# Patient Record
Sex: Female | Born: 1995 | Race: Black or African American | Hispanic: No | Marital: Single | Smoking: Never smoker
Health system: Southern US, Community
[De-identification: ages and names within clinical notes are randomized; demographics above are authoritative.]

## PROBLEM LIST (undated history)

## (undated) DIAGNOSIS — F419 Anxiety disorder, unspecified: Secondary | ICD-10-CM

---

## 2013-07-28 ENCOUNTER — Emergency Department (HOSPITAL_COMMUNITY)
Admission: EM | Admit: 2013-07-28 | Discharge: 2013-07-28 | Disposition: A | Discharge status: Home / Self Care | Payer: BC Managed Care – PPO | Attending: Emergency Medicine | Admitting: Emergency Medicine

## 2013-07-28 ENCOUNTER — Encounter (HOSPITAL_COMMUNITY): Payer: Self-pay | Admitting: Emergency Medicine

## 2013-07-28 DIAGNOSIS — R05 Cough: Secondary | ICD-10-CM | POA: Insufficient documentation

## 2013-07-28 DIAGNOSIS — R509 Fever, unspecified: Secondary | ICD-10-CM | POA: Insufficient documentation

## 2013-07-28 DIAGNOSIS — B9789 Other viral agents as the cause of diseases classified elsewhere: Secondary | ICD-10-CM | POA: Insufficient documentation

## 2013-07-28 DIAGNOSIS — R059 Cough, unspecified: Secondary | ICD-10-CM | POA: Insufficient documentation

## 2013-07-28 DIAGNOSIS — J029 Acute pharyngitis, unspecified: Secondary | ICD-10-CM

## 2013-07-28 LAB — RAPID STREP SCREEN (MED CTR MEBANE ONLY): Streptococcus, Group A Screen (Direct): NEGATIVE

## 2013-07-28 MED ORDER — HYDROCODONE-HOMATROPINE 5-1.5 MG/5ML PO SYRP: 5.0000 mL | ORAL_SOLUTION | Freq: Three times a day (TID) | ORAL | Status: DC | PRN

## 2013-07-28 NOTE — ED Provider Notes (Signed)
CSN: [id number]     Arrival date & time 07/28/13  1744 History  This chart was scribed for non-physician practitioner Antony Madura, PA-C working with Rolan Bucco, MD by Leone Payor, ED Scribe. This patient was seen in room WA02/WA02 and the patient's care was started at 1744.    Chief Complaint  Patient presents with  . Sore Throat    The history is provided by the patient. No language interpreter was used.    HPI Comments: Robin Ross is a 17 y.o. female who presents to the Emergency Department complaining of 5 days of gradual onset, gradually worsening, constant sore throat. Pt reports having subjective fever that has since resolved. She states nothing alleviates the pain. She has tried peppermint tea with no relief. She reports having a negative strep test 2 days ago. She reports having sick contacts. She denies cough, SOB, nasal congestion, rhinorrhea.   History reviewed. No pertinent past medical history. History reviewed. No pertinent past surgical history. History reviewed. No pertinent family history. History  Substance Use Topics  . Smoking status: Never Smoker   . Smokeless tobacco: Not on file  . Alcohol Use: No   OB History   Grav Para Term Preterm Abortions TAB SAB Ect Mult Living                 Review of Systems  Constitutional: Positive for fever (subjective).  HENT: Positive for sore throat. Negative for drooling and trouble swallowing.   Respiratory: Negative for cough and shortness of breath.   All other systems reviewed and are negative.    Allergies  Review of patient's allergies indicates no known allergies.  Home Medications   Current Outpatient Rx  Name  Route  Sig  Dispense  Refill  . ibuprofen (ADVIL,MOTRIN) 200 MG tablet   Oral   Take 400 mg by mouth every 6 (six) hours as needed for pain or fever.         . Pseudoeph-Doxylamine-DM-APAP (NYQUIL PO)   Oral   Take 2 tablets by mouth daily as needed (sleep).         Marland Kitchen  HYDROcodone-homatropine (HYCODAN) 5-1.5 MG/5ML syrup   Oral   Take 5 mLs by mouth every 8 (eight) hours as needed for cough.   60 mL   0    BP 118/55  Pulse 60  Temp(Src) 98.2 F (36.8 C) (Oral)  Resp 18  SpO2 100%  Physical Exam  Nursing note and vitals reviewed. Constitutional: She is oriented to person, place, and time. She appears well-developed and well-nourished. No distress.  HENT:  Head: Normocephalic and atraumatic.  Right Ear: Hearing, tympanic membrane, external ear and ear canal normal.  Left Ear: Hearing, tympanic membrane, external ear and ear canal normal.  Mouth/Throat: Uvula is midline and mucous membranes are normal. Posterior oropharyngeal erythema (mild ) present. No oropharyngeal exudate, posterior oropharyngeal edema or tonsillar abscesses.  Eyes: Conjunctivae and EOM are normal. No scleral icterus.  Neck: Normal range of motion. Neck supple.  Cardiovascular: Normal rate, regular rhythm and normal heart sounds.  Exam reveals no gallop and no friction rub.   No murmur heard. Pulmonary/Chest: Effort normal and breath sounds normal. No stridor. No respiratory distress. She has no wheezes. She has no rales. She exhibits no tenderness.  Musculoskeletal: Normal range of motion.  Lymphadenopathy:    She has no cervical adenopathy.  Neurological: She is alert and oriented to person, place, and time.  Skin: Skin is warm and dry. No rash  noted. She is not diaphoretic. No erythema. No pallor.  Psychiatric: She has a normal mood and affect. Her behavior is normal.    ED Course  Procedures   DIAGNOSTIC STUDIES: Oxygen Saturation is 100% on RA, normal by my interpretation.    COORDINATION OF CARE: 8:18 PM Will order rapid strep test. Discussed treatment plan with pt at bedside and pt agreed to plan.    Labs Review Labs Reviewed  RAPID STREP SCREEN  CULTURE, GROUP A STREP   Imaging Review No results found.  EKG Interpretation   None       MDM   1.  Viral pharyngitis    17 y/o otherwise healthy female presents for a cough and sore throat x5 days. She is well and nontoxic appearing, hemodynamically stable, and afebrile. Physical exam findings significant for mild posterior oropharyngeal erythema. Rapid strep screen negative today. Uvula midline without evidence of peritonsillar abscess and patient tolerating secretions well without difficulty. Airway patent. She is also without fever, tachycardia, tachypnea, dyspnea, or hypoxia; doubt pneumonia. Symptoms consistent with a viral illness. Patient appropriate for discharge with primary care followup as needed. Have prescribed Hycodan for cough and sore throat and recommended rest as well as adequate fluid hydration. Return precautions provided and patient agreeable to plan with no unaddressed concerns.   I personally performed the services described in this documentation, which was scribed in my presence. The recorded information has been reviewed and is accurate.    Antony Madura, PA-C 07/28/13 2124

## 2013-07-28 NOTE — ED Notes (Signed)
Pt reports had strep test done earlier today at school and it was negative.

## 2013-07-28 NOTE — ED Notes (Signed)
Pt states that she has been having a sore throat x 1 wk.  States that she had a headache and fever at the beginning of the week but that has since resolved.  Still c/o sore throat.

## 2013-07-28 NOTE — ED Provider Notes (Signed)
Medical screening examination/treatment/procedure(s) were performed by non-physician practitioner and as supervising physician I was immediately available for consultation/collaboration.   Strother Everitt, MD 07/28/13 2358 

## 2013-07-30 LAB — CULTURE, GROUP A STREP

## 2014-09-01 ENCOUNTER — Emergency Department (HOSPITAL_COMMUNITY)
Admission: EM | Admit: 2014-09-01 | Discharge: 2014-09-01 | Disposition: A | Discharge status: Home / Self Care | Payer: BC Managed Care – PPO | Source: Home / Self Care | Attending: Family Medicine | Admitting: Family Medicine

## 2014-09-01 ENCOUNTER — Encounter (HOSPITAL_COMMUNITY): Payer: Self-pay

## 2014-09-01 ENCOUNTER — Ambulatory Visit (HOSPITAL_COMMUNITY): Payer: BC Managed Care – PPO | Attending: Emergency Medicine

## 2014-09-01 DIAGNOSIS — R05 Cough: Secondary | ICD-10-CM | POA: Diagnosis not present

## 2014-09-01 DIAGNOSIS — J40 Bronchitis, not specified as acute or chronic: Secondary | ICD-10-CM

## 2014-09-01 DIAGNOSIS — R509 Fever, unspecified: Secondary | ICD-10-CM | POA: Diagnosis not present

## 2014-09-01 LAB — POCT PREGNANCY, URINE: PREG TEST UR: NEGATIVE

## 2014-09-01 MED ORDER — HYDROCOD POLST-CHLORPHEN POLST 10-8 MG/5ML PO LQCR: 5.0000 mL | Freq: Two times a day (BID) | ORAL | Status: DC | PRN

## 2014-09-01 MED ORDER — AZITHROMYCIN 250 MG PO TABS: ORAL_TABLET | ORAL | Status: DC

## 2014-09-01 NOTE — ED Notes (Signed)
Cough x 1 week, yellow and blood tinted

## 2014-09-01 NOTE — ED Provider Notes (Signed)
CSN: [id number]     Arrival date & time 09/01/14  0901 History   First MD Initiated Contact with Patient 09/01/14 [phone]     Chief Complaint  Patient presents with  . Cough   (Consider location/radiation/quality/duration/timing/severity/associated sxs/prior Treatment) HPI          18 year old female presents for evaluation of cough, fever, hemoptysis. Her cough initially began about 6 days ago. She had a high fever 103F for about 3 days. The cough has been productive of sputum and some blood as well. She feels like the fever has gotten slightly better but she still has a severe cough. The cough is constant but worse with laying flat. She denies nausea, vomiting, chest pain, shortness of breath. No pleuritic chest pain. No recent travel or sick contacts. No sinus pain or pressure  History reviewed. No pertinent past medical history. History reviewed. No pertinent past surgical history. History reviewed. No pertinent family history. History  Substance Use Topics  . Smoking status: Never Smoker   . Smokeless tobacco: Not on file  . Alcohol Use: No   OB History    No data available     Review of Systems  Constitutional: Positive for fever, chills and fatigue.  HENT: Positive for congestion. Negative for ear pain, rhinorrhea, sinus pressure and sore throat.   Respiratory: Positive for cough. Negative for chest tightness, shortness of breath and wheezing.   Cardiovascular: Negative for chest pain.  All other systems reviewed and are negative.   Allergies  Review of patient's allergies indicates no known allergies.  Home Medications   Prior to Admission medications   Medication Sig Start Date End Date Taking? Authorizing Provider  azithromycin (ZITHROMAX Z-PAK) 250 MG tablet Use as directed 09/01/14   Graylon GoodZachary H Ela Moffat, PA-C  chlorpheniramine-HYDROcodone (TUSSIONEX PENNKINETIC ER) 10-8 MG/5ML LQCR Take 5 mLs by mouth every 12 (twelve) hours as needed for cough. 09/01/14   Adrian BlackwaterZachary H  Yomar Mejorado, PA-C  HYDROcodone-homatropine (HYCODAN) 5-1.5 MG/5ML syrup Take 5 mLs by mouth every 8 (eight) hours as needed for cough. 07/28/13   Antony MaduraKelly Humes, PA-C  ibuprofen (ADVIL,MOTRIN) 200 MG tablet Take 400 mg by mouth every 6 (six) hours as needed for pain or fever.    Historical Provider, MD  Pseudoeph-Doxylamine-DM-APAP (NYQUIL PO) Take 2 tablets by mouth daily as needed (sleep).    Historical Provider, MD   BP 123/60 mmHg  Pulse 90  Temp(Src) 100.2 F (37.9 C) (Oral)  Resp 12  SpO2 99%  LMP 08/27/2014 Physical Exam  Constitutional: She is oriented to person, place, and time. Vital signs are normal. She appears well-developed and well-nourished. No distress.  HENT:  Head: Normocephalic and atraumatic.  Right Ear: External ear normal.  Left Ear: External ear normal.  Nose: Nose normal. Right sinus exhibits no maxillary sinus tenderness and no frontal sinus tenderness. Left sinus exhibits no maxillary sinus tenderness and no frontal sinus tenderness.  Mouth/Throat: Oropharynx is clear and moist. No oropharyngeal exudate.  Eyes: Conjunctivae are normal. Right eye exhibits no discharge. Left eye exhibits no discharge.  Neck: Normal range of motion. Neck supple.  Cardiovascular: Normal rate, regular rhythm and normal heart sounds.   Pulmonary/Chest: Effort normal and breath sounds normal. No respiratory distress. She has no wheezes. She has no rales.  Lymphadenopathy:    She has no cervical adenopathy.  Neurological: She is alert and oriented to person, place, and time. She has normal strength. Coordination normal.  Skin: Skin is warm and dry. No rash noted.  She is not diaphoretic.  Psychiatric: She has a normal mood and affect. Judgment normal.  Nursing note and vitals reviewed.   ED Course  Procedures (including critical care time) Labs Review Labs Reviewed  POCT PREGNANCY, URINE    Imaging Review Dg Chest 2 View  09/01/2014   CLINICAL DATA:  18 year old female with fever  and productive cough with yellow phlegm.  EXAM: CHEST  2 VIEW  COMPARISON:  None.  FINDINGS: The cardiomediastinal silhouette is unremarkable.  There is no evidence of focal airspace disease, pulmonary edema, suspicious pulmonary nodule/mass, pleural effusion, or pneumothorax. No acute bony abnormalities are identified.  IMPRESSION: No active cardiopulmonary disease.   Electronically Signed   By: Laveda AbbeJeff  Hu M.D.   On: 09/01/2014 11:10     MDM   1. Bronchitis    Chest x-ray is normal. Will provide azithromycin given the duration of illness and still having a fever. Suspicion for bacterial etiology. Also cough suppressant. Follow-up when necessary   Meds ordered this encounter  Medications  . azithromycin (ZITHROMAX Z-PAK) 250 MG tablet    Sig: Use as directed    Dispense:  6 each    Refill:  0    Order Specific Question:  Supervising Provider    Answer:  Clementeen GrahamOREY, EVAN, S [[id number]]  . chlorpheniramine-HYDROcodone (TUSSIONEX PENNKINETIC ER) 10-8 MG/5ML LQCR    Sig: Take 5 mLs by mouth every 12 (twelve) hours as needed for cough.    Dispense:  115 mL    Refill:  0    Order Specific Question:  Supervising Provider    Answer:  Clementeen GrahamOREY, EVAN, Kathie RhodesS [[id number]]      Graylon GoodZachary H Dawana Asper, PA-C 09/01/14 1121

## 2014-09-01 NOTE — Discharge Instructions (Signed)
Upper Respiratory Infection, Adult An upper respiratory infection (URI) is also sometimes known as the common cold. The upper respiratory tract includes the nose, sinuses, throat, trachea, and bronchi. Bronchi are the airways leading to the lungs. Most people improve within 1 week, but symptoms can last up to 2 weeks. A residual cough may last even longer.  CAUSES Many different viruses can infect the tissues lining the upper respiratory tract. The tissues become irritated and inflamed and often become very moist. Mucus production is also common. A cold is contagious. You can easily spread the virus to others by oral contact. This includes kissing, sharing a glass, coughing, or sneezing. Touching your mouth or nose and then touching a surface, which is then touched by another person, can also spread the virus. SYMPTOMS  Symptoms typically develop 1 to 3 days after you come in contact with a cold virus. Symptoms vary from person to person. They may include:  Runny nose.  Sneezing.  Nasal congestion.  Sinus irritation.  Sore throat.  Loss of voice (laryngitis).  Cough.  Fatigue.  Muscle aches.  Loss of appetite.  Headache.  Low-grade fever. DIAGNOSIS  You might diagnose your own cold based on familiar symptoms, since most people get a cold 2 to 3 times a year. Your caregiver can confirm this based on your exam. Most importantly, your caregiver can check that your symptoms are not due to another disease such as strep throat, sinusitis, pneumonia, asthma, or epiglottitis. Blood tests, throat tests, and X-rays are not necessary to diagnose a common cold, but they may sometimes be helpful in excluding other more serious diseases. Your caregiver will decide if any further tests are required. RISKS AND COMPLICATIONS  You may be at risk for a more severe case of the common cold if you smoke cigarettes, have chronic heart disease (such as heart failure) or lung disease (such as asthma), or if  you have a weakened immune system. The very young and very old are also at risk for more serious infections. Bacterial sinusitis, middle ear infections, and bacterial pneumonia can complicate the common cold. The common cold can worsen asthma and chronic obstructive pulmonary disease (COPD). Sometimes, these complications can require emergency medical care and may be life-threatening. PREVENTION  The best way to protect against getting a cold is to practice good hygiene. Avoid oral or hand contact with people with cold symptoms. Wash your hands often if contact occurs. There is no clear evidence that vitamin C, vitamin E, echinacea, or exercise reduces the chance of developing a cold. However, it is always recommended to get plenty of rest and practice good nutrition. TREATMENT  Treatment is directed at relieving symptoms. There is no cure. Antibiotics are not effective, because the infection is caused by a virus, not by bacteria. Treatment may include:  Increased fluid intake. Sports drinks offer valuable electrolytes, sugars, and fluids.  Breathing heated mist or steam (vaporizer or shower).  Eating chicken soup or other clear broths, and maintaining good nutrition.  Getting plenty of rest.  Using gargles or lozenges for comfort.  Controlling fevers with ibuprofen or acetaminophen as directed by your caregiver.  Increasing usage of your inhaler if you have asthma. Zinc gel and zinc lozenges, taken in the first 24 hours of the common cold, can shorten the duration and lessen the severity of symptoms. Pain medicines may help with fever, muscle aches, and throat pain. A variety of non-prescription medicines are available to treat congestion and runny nose. Your caregiver   can make recommendations and may suggest nasal or lung inhalers for other symptoms.  HOME CARE INSTRUCTIONS   Only take over-the-counter or prescription medicines for pain, discomfort, or fever as directed by your  caregiver.  Use a warm mist humidifier or inhale steam from a shower to increase air moisture. This may keep secretions moist and make it easier to breathe.  Drink enough water and fluids to keep your urine clear or pale yellow.  Rest as needed.  Return to work when your temperature has returned to normal or as your caregiver advises. You may need to stay home longer to avoid infecting others. You can also use a face mask and careful hand washing to prevent spread of the virus. SEEK MEDICAL CARE IF:   After the first few days, you feel you are getting worse rather than better.  You need your caregiver's advice about medicines to control symptoms.  You develop chills, worsening shortness of breath, or brown or red sputum. These may be signs of pneumonia.  You develop yellow or brown nasal discharge or pain in the face, especially when you bend forward. These may be signs of sinusitis.  You develop a fever, swollen neck glands, pain with swallowing, or white areas in the back of your throat. These may be signs of strep throat. SEEK IMMEDIATE MEDICAL CARE IF:   You have a fever.  You develop severe or persistent headache, ear pain, sinus pain, or chest pain.  You develop wheezing, a prolonged cough, cough up blood, or have a change in your usual mucus (if you have chronic lung disease).  You develop sore muscles or a stiff neck. Document Released: 03/23/2001 Document Revised: 12/20/2011 Document Reviewed: 01/02/2014 [employer] Patient Information 2015 [employer]. This information is not intended to replace advice given to you by your health care provider. Make sure you discuss any questions you have with your health care provider.  

## 2015-01-07 ENCOUNTER — Encounter (HOSPITAL_COMMUNITY): Payer: Self-pay | Admitting: Emergency Medicine

## 2015-01-07 ENCOUNTER — Emergency Department (HOSPITAL_COMMUNITY)
Admission: EM | Admit: 2015-01-07 | Discharge: 2015-01-07 | Disposition: A | Discharge status: Home / Self Care | Payer: BLUE CROSS/BLUE SHIELD | Source: Home / Self Care | Attending: Emergency Medicine | Admitting: Emergency Medicine

## 2015-01-07 DIAGNOSIS — J069 Acute upper respiratory infection, unspecified: Secondary | ICD-10-CM

## 2015-01-07 DIAGNOSIS — B9789 Other viral agents as the cause of diseases classified elsewhere: Principal | ICD-10-CM

## 2015-01-07 MED ORDER — PREDNISONE 50 MG PO TABS: ORAL_TABLET | ORAL | Status: DC

## 2015-01-07 MED ORDER — HYDROCODONE-HOMATROPINE 5-1.5 MG/5ML PO SYRP: 5.0000 mL | ORAL_SOLUTION | Freq: Three times a day (TID) | ORAL | Status: DC | PRN

## 2015-01-07 MED ORDER — CETIRIZINE HCL 10 MG PO TABS: 10.0000 mg | ORAL_TABLET | Freq: Every day | ORAL | Status: DC

## 2015-01-07 NOTE — ED Notes (Signed)
Pt states that she has had a cough with congestion for a week

## 2015-01-07 NOTE — ED Provider Notes (Signed)
CSN: [id number]     Arrival date & time 01/07/15  1747 History   First MD Initiated Contact with Patient 01/07/15 1912     Chief Complaint  Patient presents with  . URI   (Consider location/radiation/quality/duration/timing/severity/associated sxs/prior Treatment) HPI  She is a 19 year old woman here for evaluation of cough. She states her symptoms started about 5 days ago with nasal congestion and cough. She reports a subjective fever early in the course, this has resolved. She denies any sore throat, nausea, vomiting, ear pain. She has tried over-the-counter Mucinex and DayQuil with minimal improvement. She is worried because she had bronchitis last fall and does not want to get that sick again.  History reviewed. No pertinent past medical history. History reviewed. No pertinent past surgical history. History reviewed. No pertinent family history. History  Substance Use Topics  . Smoking status: Never Smoker   . Smokeless tobacco: Not on file  . Alcohol Use: No   OB History    No data available     Review of Systems  Constitutional: Positive for fever. Negative for chills and appetite change.  HENT: Positive for congestion and rhinorrhea. Negative for ear pain and sore throat.   Respiratory: Positive for cough. Negative for shortness of breath.   Gastrointestinal: Negative for nausea and vomiting.    Allergies  Review of patient's allergies indicates no known allergies.  Home Medications   Prior to Admission medications   Medication Sig Start Date End Date Taking? Authorizing Provider  cetirizine (ZYRTEC) 10 MG tablet Take 1 tablet (10 mg total) by mouth daily. 01/07/15   Charm RingsErin J Honig, MD  HYDROcodone-homatropine (HYCODAN) 5-1.5 MG/5ML syrup Take 5 mLs by mouth every 8 (eight) hours as needed for cough. 01/07/15   Charm RingsErin J Honig, MD  ibuprofen (ADVIL,MOTRIN) 200 MG tablet Take 400 mg by mouth every 6 (six) hours as needed for pain or fever.    Historical Provider, MD   predniSONE (DELTASONE) 50 MG tablet Take 1 pill daily for 5 days. 01/07/15   Charm RingsErin J Honig, MD   BP 105/48 mmHg  Pulse 67  Temp(Src) 98.1 F (36.7 C) (Oral)  Resp 14  SpO2 97%  LMP  Physical Exam  Constitutional: She is oriented to person, place, and time. She appears well-developed and well-nourished. No distress.  HENT:  Nose: Mucosal edema and rhinorrhea present.  Mouth/Throat: Oropharynx is clear and moist. No oropharyngeal exudate.  Neck: Neck supple.  Cardiovascular: Normal rate, regular rhythm and normal heart sounds.   No murmur heard. Pulmonary/Chest: Effort normal and breath sounds normal. No respiratory distress. She has no wheezes. She has no rales.  Lymphadenopathy:    She has no cervical adenopathy.  Neurological: She is alert and oriented to person, place, and time.    ED Course  Procedures (including critical care time) Labs Review Labs Reviewed - No data to display  Imaging Review No results found.   MDM   1. Viral URI with cough    Symptomatic treatment with Zyrtec and Hycodan cough syrup. If she is not improving in 2 days, she filled the prescription for prednisone. Follow-up as needed.    Charm RingsErin J Honig, MD 01/07/15 2001

## 2015-01-07 NOTE — Discharge Instructions (Signed)
You have a cold virus. Take Zyrtec daily to help with the mucus. Use the cough syrup at night as needed. Do not drive while taking this medicine. If you are not starting to improve in 2 days, take the prednisone. Follow-up as needed.

## 2015-12-06 ENCOUNTER — Emergency Department (HOSPITAL_COMMUNITY)
Admission: EM | Admit: 2015-12-06 | Discharge: 2015-12-06 | Disposition: A | Discharge status: Home / Self Care | Payer: BLUE CROSS/BLUE SHIELD | Source: Home / Self Care | Attending: Emergency Medicine | Admitting: Emergency Medicine

## 2015-12-06 ENCOUNTER — Encounter (HOSPITAL_COMMUNITY): Payer: Self-pay | Admitting: *Deleted

## 2015-12-06 DIAGNOSIS — J40 Bronchitis, not specified as acute or chronic: Secondary | ICD-10-CM | POA: Diagnosis not present

## 2015-12-06 MED ORDER — ALBUTEROL SULFATE HFA 108 (90 BASE) MCG/ACT IN AERS: 2.0000 | INHALATION_SPRAY | RESPIRATORY_TRACT | Status: DC | PRN

## 2015-12-06 MED ORDER — AMOXICILLIN 250 MG PO CAPS: 250.0000 mg | ORAL_CAPSULE | Freq: Two times a day (BID) | ORAL | Status: AC

## 2015-12-06 NOTE — Discharge Instructions (Signed)
Upper Respiratory Infection, Adult Most upper respiratory infections (URIs) are caused by a virus. A URI affects the nose, throat, and upper air passages. The most common type of URI is often called "the common cold." HOME CARE   Take medicines only as told by your doctor.  Gargle warm saltwater or take cough drops to comfort your throat as told by your doctor.  Use a warm mist humidifier or inhale steam from a shower to increase air moisture. This may make it easier to breathe.  Drink enough fluid to keep your pee (urine) clear or pale yellow.  Eat soups and other clear broths.  Have a healthy diet.  Rest as needed.  Go back to work when your fever is gone or your doctor says it is okay.  You may need to stay home longer to avoid giving your URI to others.  You can also wear a face mask and wash your hands often to prevent spread of the virus.  Use your inhaler more if you have asthma.  Do not use any tobacco products, including cigarettes, chewing tobacco, or electronic cigarettes. If you need help quitting, ask your doctor. GET HELP IF:  You are getting worse, not better.  Your symptoms are not helped by medicine.  You have chills.  You are getting more short of breath.  You have brown or red mucus.  You have yellow or brown discharge from your nose.  You have pain in your face, especially when you bend forward.  You have a fever.  You have puffy (swollen) neck glands.  You have pain while swallowing.  You have white areas in the back of your throat. GET HELP RIGHT AWAY IF:   You have very bad or constant:  Headache.  Ear pain.  Pain in your forehead, behind your eyes, and over your cheekbones (sinus pain).  Chest pain.  You have long-lasting (chronic) lung disease and any of the following:  Wheezing.  Long-lasting cough.  Coughing up blood.  A change in your usual mucus.  You have a stiff neck.  You have changes in  your:  Vision.  Hearing.  Thinking.  Mood. MAKE SURE YOU:   Understand these instructions.  Will watch your condition.  Will get help right away if you are not doing well or get worse.   This information is not intended to replace advice given to you by your health care provider. Make sure you discuss any questions you have with your health care provider.   Document Released: 03/15/2008 Document Revised: 02/11/2015 Document Reviewed: 01/02/2014 Elsevier Interactive Patient Education 2016 [employer]. Smoking Cessation, Tips for Success If you are ready to quit smoking, congratulations! You have chosen to help yourself be healthier. Cigarettes bring nicotine, tar, carbon monoxide, and other irritants into your body. Your lungs, heart, and blood vessels will be able to work better without these poisons. There are many different ways to quit smoking. Nicotine gum, nicotine patches, a nicotine inhaler, or nicotine nasal spray can help with physical craving. Hypnosis, support groups, and medicines help break the habit of smoking. WHAT THINGS CAN I DO TO MAKE QUITTING EASIER?  Here are some tips to help you quit for good:  Pick a date when you will quit smoking completely. Tell all of your friends and family about your plan to quit on that date.  Do not try to slowly cut down on the number of cigarettes you are smoking. Pick a quit date and quit smoking completely  starting on that day.  Throw away all cigarettes.   Clean and remove all ashtrays from your home, work, and car.  On a card, write down your reasons for quitting. Carry the card with you and read it when you get the urge to smoke.  Cleanse your body of nicotine. Drink enough water and fluids to keep your urine clear or pale yellow. Do this after quitting to flush the nicotine from your body.  Learn to predict your moods. Do not let a bad situation be your excuse to have a cigarette. Some situations in your life might  tempt you into wanting a cigarette.  Never have "just one" cigarette. It leads to wanting another and another. Remind yourself of your decision to quit.  Change habits associated with smoking. If you smoked while driving or when feeling stressed, try other activities to replace smoking. Stand up when drinking your coffee. Brush your teeth after eating. Sit in a different chair when you read the paper. Avoid alcohol while trying to quit, and try to drink fewer caffeinated beverages. Alcohol and caffeine may urge you to smoke.  Avoid foods and drinks that can trigger a desire to smoke, such as sugary or spicy foods and alcohol.  Ask people who smoke not to smoke around you.  Have something planned to do right after eating or having a cup of coffee. For example, plan to take a walk or exercise.  Try a relaxation exercise to calm you down and decrease your stress. Remember, you may be tense and nervous for the first 2 weeks after you quit, but this will pass.  Find new activities to keep your hands busy. Play with a pen, coin, or rubber band. Doodle or draw things on paper.  Brush your teeth right after eating. This will help cut down on the craving for the taste of tobacco after meals. You can also try mouthwash.   Use oral substitutes in place of cigarettes. Try using lemon drops, carrots, cinnamon sticks, or chewing gum. Keep them handy so they are available when you have the urge to smoke.  When you have the urge to smoke, try deep breathing.  Designate your home as a nonsmoking area.  If you are a heavy smoker, ask your health care provider about a prescription for nicotine chewing gum. It can ease your withdrawal from nicotine.  Reward yourself. Set aside the cigarette money you save and buy yourself something nice.  Look for support from others. Join a support group or smoking cessation program. Ask someone at home or at work to help you with your plan to quit smoking.  Always ask  yourself, "Do I need this cigarette or is this just a reflex?" Tell yourself, "Today, I choose not to smoke," or "I do not want to smoke." You are reminding yourself of your decision to quit.  Do not replace cigarette smoking with electronic cigarettes (commonly called e-cigarettes). The safety of e-cigarettes is unknown, and some may contain harmful chemicals.  If you relapse, do not give up! Plan ahead and think about what you will do the next time you get the urge to smoke. HOW WILL I FEEL WHEN I QUIT SMOKING? You may have symptoms of withdrawal because your body is used to nicotine (the addictive substance in cigarettes). You may crave cigarettes, be irritable, feel very hungry, cough often, get headaches, or have difficulty concentrating. The withdrawal symptoms are only temporary. They are strongest when you first quit but will go  away within 10-14 days. When withdrawal symptoms occur, stay in control. Think about your reasons for quitting. Remind yourself that these are signs that your body is healing and getting used to being without cigarettes. Remember that withdrawal symptoms are easier to treat than the major diseases that smoking can cause.  Even after the withdrawal is over, expect periodic urges to smoke. However, these cravings are generally short lived and will go away whether you smoke or not. Do not smoke! WHAT RESOURCES ARE AVAILABLE TO HELP ME QUIT SMOKING? Your health care provider can direct you to community resources or hospitals for support, which may include:  Group support.  Education.  Hypnosis.  Therapy.   This information is not intended to replace advice given to you by your health care provider. Make sure you discuss any questions you have with your health care provider.   Document Released: 06/25/2004 Document Revised: 10/18/2014 Document Reviewed: 03/15/2013 Elsevier Interactive Patient Education 2016 Elsevier Inc. Acute Bronchitis Bronchitis is when the  airways that extend from the windpipe into the lungs get red, puffy, and painful (inflamed). Bronchitis often causes thick spit (mucus) to develop. This leads to a cough. A cough is the most common symptom of bronchitis. In acute bronchitis, the condition usually begins suddenly and goes away over time (usually in 2 weeks). Smoking, allergies, and asthma can make bronchitis worse. Repeated episodes of bronchitis may cause more lung problems. HOME CARE  Rest.  Drink enough fluids to keep your pee (urine) clear or pale yellow (unless you need to limit fluids as told by your doctor).  Only take over-the-counter or prescription medicines as told by your doctor.  Avoid smoking and secondhand smoke. These can make bronchitis worse. If you are a smoker, think about using nicotine gum or skin patches. Quitting smoking will help your lungs heal faster.  Reduce the chance of getting bronchitis again by:  Washing your hands often.  Avoiding people with cold symptoms.  Trying not to touch your hands to your mouth, nose, or eyes.  Follow up with your doctor as told. GET HELP IF: Your symptoms do not improve after 1 week of treatment. Symptoms include:  Cough.  Fever.  Coughing up thick spit.  Body aches.  Chest congestion.  Chills.  Shortness of breath.  Sore throat. GET HELP RIGHT AWAY IF:   You have an increased fever.  You have chills.  You have severe shortness of breath.  You have bloody thick spit (sputum).  You throw up (vomit) often.  You lose too much body fluid (dehydration).  You have a severe headache.  You faint. MAKE SURE YOU:   Understand these instructions.  Will watch your condition.  Will get help right away if you are not doing well or get worse.   This information is not intended to replace advice given to you by your health care provider. Make sure you discuss any questions you have with your health care provider.   Document Released:  03/15/2008 Document Revised: 05/30/2013 Document Reviewed: 03/20/2013 Elsevier Interactive Patient Education [employer]

## 2015-12-06 NOTE — ED Notes (Signed)
C/O productive cough x 4 days without fever.  Has not been taking any meds for sxs.

## 2015-12-06 NOTE — ED Provider Notes (Signed)
CSN: [id number]     Arrival date & time 12/06/15  1354 History   First MD Initiated Contact with Patient 12/06/15 1513     Chief Complaint  Patient presents with  . Cough   (Consider location/radiation/quality/duration/timing/severity/associated sxs/prior Treatment) HPI Pt presents with the cc of cough Symptoms started almost 2 weeks ago Symptoms get worse with cold night air Symptoms get better with use of cough medicine for short periods of time No previous symptoms of this nature Pain score 3 Other symptoms include blood tinged sputum on occasion, no fever.   History reviewed. No pertinent past medical history. History reviewed. No pertinent past surgical history. No family history on file. Social History  Substance Use Topics  . Smoking status: Never Smoker   . Smokeless tobacco: None  . Alcohol Use: Yes     Comment: occasionally   OB History    No data available     Review of Systems Cough, sputum, no fever Allergies  Review of patient's allergies indicates no known allergies.  Home Medications   Prior to Admission medications   Medication Sig Start Date End Date Taking? Authorizing Provider  albuterol (PROVENTIL HFA;VENTOLIN HFA) 108 (90 Base) MCG/ACT inhaler Inhale 2 puffs into the lungs every 4 (four) hours as needed for wheezing or shortness of breath. 12/06/15   Tharon Aquas, PA  amoxicillin (AMOXIL) 250 MG capsule Take 1 capsule (250 mg total) by mouth 2 (two) times daily. 12/06/15 12/13/15  Tharon Aquas, PA   Meds Ordered and Administered this Visit  Medications - No data to display  BP 107/69 mmHg  Pulse 72  Temp(Src) 98.2 F (36.8 C) (Oral)  Resp 18  SpO2 100%  LMP 11/22/2015 (Exact Date) No data found.   Physical Exam NURSES NOTES AND VITAL SIGNS REVIEWED. CONSTITUTIONAL: Well developed, well nourished, no acute distress HEENT: normocephalic, atraumatic, right and left TM's are normal EYES: Conjunctiva normal NECK:normal ROM, supple, no  adenopathy PULMONARY:No respiratory distress, normal effort, Lungs: CTAb/l, no wheezes, or increased work of breathing CARDIOVASCULAR: RRR, no murmur ABDOMEN: soft, ND, NT, +'ve BS MUSCULOSKELETAL: Normal ROM of all extremities,  SKIN: warm and dry without rash PSYCHIATRIC: Mood and affect, behavior are normal  ED Course  Procedures (including critical care time)  Labs Review Labs Reviewed - No data to display  Imaging Review No results found.   Visual Acuity Review  Right Eye Distance:   Left Eye Distance:   Bilateral Distance:    Right Eye Near:   Left Eye Near:    Bilateral Near:         MDM   1. Bronchitis    Patient is reassured that there is no indication for more advance testing at this time.  Patient is advised to continue home symptomatic treatment.  Patient is advised that if there are new or worsening symptoms or attend the emergency department, or contact primary care provider. Instructions of care provided discharged home in stable condition. Return to work/school note provided.  THIS NOTE WAS GENERATED USING A VOICE RECOGNITION SOFTWARE PROGRAM. ALL REASONABLE EFFORTS  WERE MADE TO PROOFREAD THIS DOCUMENT FOR ACCURACY.     Tharon Aquas, PA 12/06/15 Paulo Fruit

## 2016-02-24 ENCOUNTER — Ambulatory Visit (INDEPENDENT_AMBULATORY_CARE_PROVIDER_SITE_OTHER): Payer: BLUE CROSS/BLUE SHIELD | Admitting: Physician Assistant

## 2016-02-24 VITALS — BP 118/70 | HR 66 | Temp 98.7°F | Resp 17 | Ht 65.5 in | Wt 128.0 lb

## 2016-02-24 DIAGNOSIS — B373 Candidiasis of vulva and vagina: Secondary | ICD-10-CM | POA: Diagnosis not present

## 2016-02-24 DIAGNOSIS — Z3041 Encounter for surveillance of contraceptive pills: Secondary | ICD-10-CM

## 2016-02-24 DIAGNOSIS — B3731 Acute candidiasis of vulva and vagina: Secondary | ICD-10-CM

## 2016-02-24 DIAGNOSIS — N898 Other specified noninflammatory disorders of vagina: Secondary | ICD-10-CM | POA: Diagnosis not present

## 2016-02-24 LAB — POCT WET + KOH PREP: Trich by wet prep: ABSENT

## 2016-02-24 LAB — GLUCOSE, POCT (MANUAL RESULT ENTRY): POC Glucose: 110 mg/dl — AB (ref 70–99)

## 2016-02-24 LAB — POCT URINE PREGNANCY: Preg Test, Ur: NEGATIVE

## 2016-02-24 MED ORDER — LEVONORGEST-ETH ESTRAD 91-DAY 0.15-0.03 &0.01 MG PO TABS: 1.0000 | ORAL_TABLET | Freq: Every day | ORAL | Status: DC

## 2016-02-24 MED ORDER — FLUCONAZOLE 150 MG PO TABS: 150.0000 mg | ORAL_TABLET | Freq: Once | ORAL | Status: DC

## 2016-02-24 NOTE — Progress Notes (Signed)
[facility] [address], [place] [address] [phone]  Date:  02/24/2016   Name:  Tymeshia Awan   DOB:  16-Oct-1995   MRN:  [id number]  PCP:  No primary care provider on file.    History of Present Illness:  Iva Hillman is a 20 y.o. female patient who presents to [facility] for cc of 2 days of vaginal pruritus and abnormal discharge. This has been for the last 2 days.  She has no odor.  No vision changes, nausea, vomiting, diarrhea, abdominal pain.  No hematuria, frequency, or dysuria.   She is sexually active unprotected.  She stopped her birth control.  She had felt nausea.  She does not eat with the medication.    She eats infrequently.  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  She does not want to get pregnant at this time.  She aspires to join the [employer] once school is over in 7 months.     There are no active problems to display for this patient.   No past medical history on file.  No past surgical history on file.  Social History  Substance Use Topics  . Smoking status: Never Smoker   . Smokeless tobacco: None  . Alcohol Use: Yes     Comment: occasionally    No family history on file.  No Known Allergies  Medication list has been reviewed and updated.  Current Outpatient Prescriptions on File Prior to Visit  Medication Sig Dispense Refill  . albuterol (PROVENTIL HFA;VENTOLIN HFA) 108 (90 Base) MCG/ACT inhaler Inhale 2 puffs into the lungs every 4 (four) hours as needed for wheezing or shortness of breath. (Patient not taking: Reported on 02/24/2016) 1 Inhaler 0  . [DISCONTINUED] cetirizine (ZYRTEC) 10 MG tablet Take 1 tablet (10 mg total) by mouth daily. 30 tablet 0   No current facility-administered medications on file prior to visit.    ROS ROS otherwise unremarkable unless listed above.   Physical Examination: BP 118/70 mmHg  Pulse 66  Temp(Src) 98.7 F (37.1 C) (Oral)  Resp 17  Ht 5' 5.5"  (1.664 m)  Wt 128 lb (58.06 kg)  BMI 20.97 kg/m2  SpO2 99%  LMP 02/12/2016 Ideal Body Weight: Weight in (lb) to have BMI = 25: 152.2  Physical Exam  Constitutional: She is oriented to person, place, and time. She appears well-developed and well-nourished. No distress.  HENT:  Head: Normocephalic and atraumatic.  Right Ear: External ear normal.  Left Ear: External ear normal.  Eyes: Conjunctivae and EOM are normal. Pupils are equal, round, and reactive to light.  Cardiovascular: Normal rate.   Pulmonary/Chest: Effort normal. No respiratory distress.  Genitourinary: Pelvic exam was performed with patient supine. There is no rash on the left labia. Cervix exhibits discharge. Cervix exhibits no friability. No tenderness or bleeding in the vagina. Vaginal discharge (white) found.  Neurological: She is alert and oriented to person, place, and time.  Skin: She is not diaphoretic.  Psychiatric: She has a normal mood and affect. Her behavior is normal.    Results for orders placed or performed in visit on 02/24/16  POCT urine pregnancy  Result Value Ref Range   Preg Test, Ur Negative Negative  POCT glucose (manual entry)  Result Value Ref Range   POC Glucose 110 (A) 70 - 99 mg/dl  POCT Wet + KOH Prep  Result Value Ref Range   Yeast by KOH Present Present, Absent  Yeast by wet prep Present Present, Absent   WBC by wet prep Few None, Few, Too numerous to count   Clue Cells Wet Prep HPF POC Few (A) None, Too numerous to count   Trich by wet prep Absent Present, Absent   Bacteria Wet Prep HPF POC Few None, Few, Too numerous to count   Epithelial Cells By Newell RubbermaidWet Pref ([facility]) Few None, Few, Too numerous to count   RBC,UR,HPF,POC None None RBC/hpf     Assessment and Plan: Cloria SpringCarissa Jefcoat is a 20 y.o. female who is here today for abnormal vaginal discharge. We will treat with diflucan at this time.   I have advised and encouraged that we start her on a birth control to avoid pregnancy and  continuity of her life goals.   I have given her information of the nexplanon, if she would like to start this medication.  She will contact us if she would like this prior authorized from her insurance. We will start her on a birth control continuance--she wishes to avoid her menses.  I have advised that this will more readily work if we start during her cycle.  But she appears reluctant to use condoms with no true definitive reason why.  It is best that she start bc asap.  Yeast infection of the vagina - Plan: fluconazole (DIFLUCAN) 150 MG tablet  Vaginal discharge - Plan: POCT urine pregnancy, POCT glucose (manual entry), POCT Wet + KOH Prep  Uses oral contraception - Plan: Levonorgestrel-Ethinyl Estradiol (AMETHIA,CAMRESE) 0.15-0.03 &0.01 MG tablet  Trena PlattStephanie English, PA-C [facility] 02/24/2016 9:24 AM

## 2016-02-24 NOTE — Patient Instructions (Addendum)
IF you received an x-ray today, you will receive an invoice from [facility]. Please contact [facility] at [phone] with questions or concerns regarding your invoice.   IF you received labwork today, you will receive an invoice from [employer]. Please contact [facility] at [phone] with questions or concerns regarding your invoice.   Our billing staff will not be able to assist you with questions regarding bills from these companies.  You will be contacted with the lab results as soon as they are available. The fastest way to get your results is to activate your My Chart account. Instructions are located on the last page of this paperwork. If you have not heard from [place] regarding the results in 2 weeks, please contact this office.    I am sending you away with information regarding the seasonique and the nexplanon.  Please use a condom if you are engaging in any sexual activity.  This will keep you to goal.  I would like you to contact me if you would like to get the nexplanon.  Monilial Vaginitis Vaginitis in a soreness, swelling and redness (inflammation) of the vagina and vulva. Monilial vaginitis is not a sexually transmitted infection. CAUSES  Yeast vaginitis is caused by yeast (candida) that is normally found in your vagina. With a yeast infection, the candida has overgrown in number to a point that upsets the chemical balance. SYMPTOMS   White, thick vaginal discharge.  Swelling, itching, redness and irritation of the vagina and possibly the lips of the vagina (vulva).  Burning or painful urination.  Painful intercourse. DIAGNOSIS  Things that may contribute to monilial vaginitis are:  Postmenopausal and virginal states.  Pregnancy.  Infections.  Being tired, sick or stressed, especially if you had monilial vaginitis in the past.  Diabetes. Good control will help lower the chance.  Birth control pills.  Tight  fitting garments.  Using bubble bath, feminine sprays, douches or deodorant tampons.  Taking certain medications that kill germs (antibiotics).  Sporadic recurrence can occur if you become ill. TREATMENT  Your caregiver will give you medication.  There are several kinds of anti monilial vaginal creams and suppositories specific for monilial vaginitis. For recurrent yeast infections, use a suppository or cream in the vagina 2 times a week, or as directed.  Anti-monilial or steroid cream for the itching or irritation of the vulva may also be used. Get your caregiver's permission.  Painting the vagina with methylene blue solution may help if the monilial cream does not work.  Eating yogurt may help prevent monilial vaginitis. HOME CARE INSTRUCTIONS   Finish all medication as prescribed.  Do not have sex until treatment is completed or after your caregiver tells you it is okay.  Take warm sitz baths.  Do not douche.  Do not use tampons, especially scented ones.  Wear cotton underwear.  Avoid tight pants and panty hose.  Tell your sexual partner that you have a yeast infection. They should go to their caregiver if they have symptoms such as mild rash or itching.  Your sexual partner should be treated as well if your infection is difficult to eliminate.  Practice safer sex. Use condoms.  Some vaginal medications cause latex condoms to fail. Vaginal medications that harm condoms are:  Cleocin cream.  Butoconazole (Femstat).  Terconazole (Terazol) vaginal suppository.  Miconazole (Monistat) (may be purchased over the counter). SEEK MEDICAL CARE IF:   You have a temperature by mouth above 102 F (38.9  C).  The infection is getting worse after 2 days of treatment.  The infection is not getting better after 3 days of treatment.  You develop blisters in or around your vagina.  You develop vaginal bleeding, and it is not your menstrual period.  You have pain when  you urinate.  You develop intestinal problems.  You have pain with sexual intercourse.   This information is not intended to replace advice given to you by your health care provider. Make sure you discuss any questions you have with your health care provider.   Document Released: 07/07/2005 Document Revised: 12/20/2011 Document Reviewed: 03/31/2015 Elsevier Interactive Patient Education 2016 [employer]. Ethinyl Estradiol; Levonorgestrel tablets What is this medicine? ETHINYL ESTRADIOL; LEVONORGESTREL (ETH in il es tra DYE ole; LEE voh nor jes trel) is an oral contraceptive. It combines two types of female hormones, an estrogen and a progestin. They are used to prevent ovulation and pregnancy. This medicine may be used for other purposes; ask your health care provider or pharmacist if you have questions. What should I tell my health care provider before I take this medicine? They need to know if you have or ever had any of these conditions: -abnormal vaginal bleeding -blood vessel disease or blood clots -breast, cervical, endometrial, ovarian, liver, or uterine cancer -diabetes -gallbladder disease -heart disease or recent heart attack -high blood pressure -high cholesterol -kidney disease -liver disease -migraine headaches -stroke -systemic lupus erythematosus (SLE) -tobacco smoker -an unusual or allergic reaction to estrogens, progestins, other medicines, foods, dyes, or preservatives -pregnant or trying to get pregnant -breast-feeding How should I use this medicine? Take this medicine by mouth. To reduce nausea, this medicine may be taken with food. Follow the directions on the prescription label. Take this medicine at the same time each day and in the order directed on the package. Do not take your medicine more often than directed. Contact your pediatrician regarding the use of this medicine in children. Special care may be needed. This medicine has been used in female  children who have started having menstrual periods. A patient package insert for the product will be given with each prescription and refill. Read this sheet carefully each time. The sheet may change frequently. Overdosage: If you think you have taken too much of this medicine contact a poison control center or emergency room at once. NOTE: This medicine is only for you. Do not share this medicine with others. What if I miss a dose? If you miss a dose, refer to the patient information sheet you received with your medicine for direction. If you miss more than one pill, this medicine may not be as effective and you may need to use another form of birth control. What may interact with this medicine? -acetaminophen -antibiotics or medicines for infections, especially rifampin, rifabutin, rifapentine, and griseofulvin, and possibly penicillins or tetracyclines -aprepitant -ascorbic acid (vitamin C) -atorvastatin -barbiturate medicines, such as phenobarbital -bosentan -carbamazepine -caffeine -clofibrate -cyclosporine -dantrolene -doxercalciferol -felbamate -grapefruit juice -hydrocortisone -medicines for anxiety or sleeping problems, such as diazepam or temazepam -medicines for diabetes, including pioglitazone -mineral oil -modafinil -mycophenolate -nefazodone -oxcarbazepine -phenytoin -prednisolone -ritonavir or other medicines for HIV infection or AIDS -rosuvastatin -selegiline -soy isoflavones supplements -St. John's wort -tamoxifen or raloxifene -theophylline -thyroid hormones -topiramate -warfarin This list may not describe all possible interactions. Give your health care provider a list of all the medicines, herbs, non-prescription drugs, or dietary supplements you use. Also tell them if you smoke, drink alcohol, or use illegal  drugs. Some items may interact with your medicine. What should I watch for while using this medicine? Visit your doctor or health care  professional for regular checks on your progress. You will need a regular breast and pelvic exam and Pap smear while on this medicine. Use an additional method of contraception during the first cycle that you take these tablets. If you have any reason to think you are pregnant, stop taking this medicine right away and contact your doctor or health care professional. If you are taking this medicine for hormone related problems, it may take several cycles of use to see improvement in your condition. Smoking increases the risk of getting a blood clot or having a stroke while you are taking birth control pills, especially if you are more than 22 years old. You are strongly advised not to smoke. This medicine can make your body retain fluid, making your fingers, hands, or ankles swell. Your blood pressure can go up. Contact your doctor or health care professional if you feel you are retaining fluid. This medicine can make you more sensitive to the sun. Keep out of the sun. If you cannot avoid being in the sun, wear protective clothing and use sunscreen. Do not use sun lamps or tanning beds/booths. If you wear contact lenses and notice visual changes, or if the lenses begin to feel uncomfortable, consult your eye care specialist. In some women, tenderness, swelling, or minor bleeding of the gums may occur. Notify your dentist if this happens. Brushing and flossing your teeth regularly may help limit this. See your dentist regularly and inform your dentist of the medicines you are taking. If you are going to have elective surgery, you may need to stop taking this medicine before the surgery. Consult your health care professional for advice. This medicine does not protect you against HIV infection (AIDS) or any other sexually transmitted diseases. What side effects may I notice from receiving this medicine? Side effects that you should report to your doctor or health care professional as soon as  possible: -breast tissue changes or discharge -changes in vaginal bleeding during your period or between your periods -chest pain -coughing up blood -dizziness or fainting spells -headaches or migraines -leg, arm or groin pain -severe or sudden headaches -stomach pain (severe) -sudden shortness of breath -sudden loss of coordination, especially on one side of the body -speech problems -symptoms of vaginal infection like itching, irritation or unusual discharge -tenderness in the upper abdomen -vomiting -weakness or numbness in the arms or legs, especially on one side of the body -yellowing of the eyes or skin Side effects that usually do not require medical attention (report to your doctor or health care professional if they continue or are bothersome): -breakthrough bleeding and spotting that continues beyond the 3 initial cycles of pills -breast tenderness -mood changes, anxiety, depression, frustration, anger, or emotional outbursts -increased sensitivity to sun or ultraviolet light -nausea -skin rash, acne, or brown spots on the skin -weight gain (slight) This list may not describe all possible side effects. Call your doctor for medical advice about side effects. You may report side effects to FDA at 1-800-FDA-1088. Where should I keep my medicine? Keep out of the reach of children. Store at room temperature between 15 and 30 degrees C (59 and 86 degrees F). Throw away any unused medicine after the expiration date. NOTE: This sheet is a summary. It may not cover all possible information. If you have questions about this medicine, talk to your  doctor, pharmacist, or health care provider.    2016, Elsevier/Gold Standard. (2014-01-07 10:20:56)  Etonogestrel implant What is this medicine? ETONOGESTREL (et oh noe JES trel) is a contraceptive (birth control) device. It is used to prevent pregnancy. It can be used for up to 3 years. This medicine may be used for other purposes; ask  your health care provider or pharmacist if you have questions. What should I tell my health care provider before I take this medicine? They need to know if you have any of these conditions: -abnormal vaginal bleeding -blood vessel disease or blood clots -cancer of the breast, cervix, or liver -depression -diabetes -gallbladder disease -headaches -heart disease or recent heart attack -high blood pressure -high cholesterol -kidney disease -liver disease -renal disease -seizures -tobacco smoker -an unusual or allergic reaction to etonogestrel, other hormones, anesthetics or antiseptics, medicines, foods, dyes, or preservatives -pregnant or trying to get pregnant -breast-feeding How should I use this medicine? This device is inserted just under the skin on the inner side of your upper arm by a health care professional. Talk to your pediatrician regarding the use of this medicine in children. Special care may be needed. Overdosage: If you think you have taken too much of this medicine contact a poison control center or emergency room at once. NOTE: This medicine is only for you. Do not share this medicine with others. What if I miss a dose? This does not apply. What may interact with this medicine? Do not take this medicine with any of the following medications: -amprenavir -bosentan -fosamprenavir This medicine may also interact with the following medications: -barbiturate medicines for inducing sleep or treating seizures -certain medicines for fungal infections like ketoconazole and itraconazole -griseofulvin -medicines to treat seizures like carbamazepine, felbamate, oxcarbazepine, phenytoin, topiramate -modafinil -phenylbutazone -rifampin -some medicines to treat HIV infection like atazanavir, indinavir, lopinavir, nelfinavir, tipranavir, ritonavir -St. John's wort This list may not describe all possible interactions. Give your health care provider a list of all the  medicines, herbs, non-prescription drugs, or dietary supplements you use. Also tell them if you smoke, drink alcohol, or use illegal drugs. Some items may interact with your medicine. What should I watch for while using this medicine? This product does not protect you against HIV infection (AIDS) or other sexually transmitted diseases. You should be able to feel the implant by pressing your fingertips over the skin where it was inserted. Contact your doctor if you cannot feel the implant, and use a non-hormonal birth control method (such as condoms) until your doctor confirms that the implant is in place. If you feel that the implant may have broken or become bent while in your arm, contact your healthcare provider. What side effects may I notice from receiving this medicine? Side effects that you should report to your doctor or health care professional as soon as possible: -allergic reactions like skin rash, itching or hives, swelling of the face, lips, or tongue -breast lumps -changes in emotions or moods -depressed mood -heavy or prolonged menstrual bleeding -pain, irritation, swelling, or bruising at the insertion site -scar at site of insertion -signs of infection at the insertion site such as fever, and skin redness, pain or discharge -signs of pregnancy -signs and symptoms of a blood clot such as breathing problems; changes in vision; chest pain; severe, sudden headache; pain, swelling, warmth in the leg; trouble speaking; sudden numbness or weakness of the face, arm or leg -signs and symptoms of liver injury like dark yellow or brown urine;  general ill feeling or flu-like symptoms; light-colored stools; loss of appetite; nausea; right upper belly pain; unusually weak or tired; yellowing of the eyes or skin -unusual vaginal bleeding, discharge -signs and symptoms of a stroke like changes in vision; confusion; trouble speaking or understanding; severe headaches; sudden numbness or weakness of  the face, arm or leg; trouble walking; dizziness; loss of balance or coordination Side effects that usually do not require medical attention (Report these to your doctor or health care professional if they continue or are bothersome.): -acne -back pain -breast pain -changes in weight -dizziness -general ill feeling or flu-like symptoms -headache -irregular menstrual bleeding -nausea -sore throat -vaginal irritation or inflammation This list may not describe all possible side effects. Call your doctor for medical advice about side effects. You may report side effects to FDA at 1-800-FDA-1088. Where should I keep my medicine? This drug is given in a hospital or clinic and will not be stored at home. NOTE: This sheet is a summary. It may not cover all possible information. If you have questions about this medicine, talk to your doctor, pharmacist, or health care provider.    2016, Elsevier/Gold Standard. (2014-07-12 14:07:06)

## 2020-10-29 ENCOUNTER — Ambulatory Visit
Admission: EM | Admit: 2020-10-29 | Discharge: 2020-10-29 | Disposition: A | Discharge status: Home / Self Care | Attending: Family Medicine | Admitting: Family Medicine

## 2020-10-29 ENCOUNTER — Other Ambulatory Visit: Payer: Self-pay

## 2020-10-29 DIAGNOSIS — B349 Viral infection, unspecified: Secondary | ICD-10-CM

## 2020-10-29 NOTE — ED Provider Notes (Signed)
[name]    CSN: [id number] Arrival date & time: 10/29/20  [id number]      History   Chief Complaint No chief complaint on file.   HPI Robin Ross is a 25 y.o. female.   Patient is a 25 year old female who presents today with cough, congestion, headache, body aches, low-grade fever.  This started yesterday.  Using her inhaler as needed.  No cough, wheezing or shortness of breath.  Possible COVID exposures at work     No past medical history on file.  There are no problems to display for this patient.   No past surgical history on file.  OB History   No obstetric history on file.      Home Medications    Prior to Admission medications   Medication Sig Start Date End Date Taking? Authorizing Provider  albuterol (PROVENTIL HFA;VENTOLIN HFA) 108 (90 Base) MCG/ACT inhaler Inhale 2 puffs into the lungs every 4 (four) hours as needed for wheezing or shortness of breath. Patient not taking: Reported on 02/24/2016 12/06/15   Tharon Aquas, PA  Levonorgestrel-Ethinyl Estradiol (AMETHIA,CAMRESE) 0.15-0.03 &0.01 MG tablet Take 1 tablet by mouth daily. 02/24/16   Trena Platt D, PA  cetirizine (ZYRTEC) 10 MG tablet Take 1 tablet (10 mg total) by mouth daily. 01/07/15 12/06/15  Charm Rings, MD    Family History No family history on file.  Social History Social History   Tobacco Use  . Smoking status: Never Smoker  Substance Use Topics  . Alcohol use: Yes    Comment: occasionally  . Drug use: Yes    Types: Marijuana     Allergies   Patient has no known allergies.   Review of Systems Review of Systems   Physical Exam Triage Vital Signs ED Triage Vitals [10/29/20 0915]  Enc Vitals Group     BP 110/74     Pulse Rate 81     Resp 18     Temp 99.5 F (37.5 C)     Temp Source Oral     SpO2 97 %     Weight      Height      Head Circumference      Peak Flow      Pain Score      Pain Loc      Pain Edu?      Excl. in GC?    No data  found.  Updated Vital Signs BP 110/74 (BP Location: Left Arm)   Pulse 81   Temp 99.5 F (37.5 C) (Oral)   Resp 18   SpO2 97%   Visual Acuity Right Eye Distance:   Left Eye Distance:   Bilateral Distance:    Right Eye Near:   Left Eye Near:    Bilateral Near:     Physical Exam Vitals and nursing note reviewed.  Constitutional:      General: She is not in acute distress.    Appearance: Normal appearance. She is not ill-appearing, toxic-appearing or diaphoretic.  HENT:     Head: Normocephalic.     Nose: Congestion present.     Mouth/Throat:     Pharynx: Oropharynx is clear.  Eyes:     Conjunctiva/sclera: Conjunctivae normal.  Cardiovascular:     Rate and Rhythm: Normal rate and regular rhythm.  Pulmonary:     Effort: Pulmonary effort is normal.     Breath sounds: Normal breath sounds.  Musculoskeletal:        General: Normal range  of motion.     Cervical back: Normal range of motion.  Skin:    General: Skin is warm and dry.     Findings: No rash.  Neurological:     Mental Status: She is alert.  Psychiatric:        Mood and Affect: Mood normal.      UC Treatments / Results  Labs (all labs ordered are listed, but only abnormal results are displayed) Labs Reviewed  COVID-19, FLU A+B NAA    EKG   Radiology No results found.  Procedures Procedures (including critical care time)  Medications Ordered in UC Medications - No data to display  Initial Impression / Assessment and Plan / UC Course  I have reviewed the triage vital signs and the nursing notes.  Pertinent labs & imaging results that were available during my care of the patient were reviewed by me and considered in my medical decision making (see chart for details).       Viral illness Nothing concerning on exam. COVID and flu test pending. Recommend over-the-counter medicines as needed for symptoms.  Rest, hydrate. Follow up as needed for continued or worsening symptoms  Final Clinical  Impressions(s) / UC Diagnoses   Final diagnoses:  Viral illness     Discharge Instructions     Covid and flu test pending You can take OTC medicines as needed Recommend mucinex, tylenol and ibuprofen Rest, hydrate Follow up as needed for continued or worsening symptoms     ED Prescriptions    None     PDMP not reviewed this encounter.   Janace Aris, NP 10/29/20 [phone]

## 2020-10-29 NOTE — Discharge Instructions (Addendum)
Covid and flu test pending You can take OTC medicines as needed Recommend mucinex, tylenol and ibuprofen Rest, hydrate Follow up as needed for continued or worsening symptoms

## 2020-10-31 ENCOUNTER — Telehealth (HOSPITAL_COMMUNITY): Payer: Self-pay | Admitting: Emergency Medicine

## 2020-10-31 LAB — COVID-19, FLU A+B NAA
Influenza A, NAA: NOT DETECTED
Influenza B, NAA: NOT DETECTED
SARS-CoV-2, NAA: DETECTED — AB

## 2020-10-31 NOTE — Telephone Encounter (Signed)
Patient called for results, verified identity using two identifiers, and provided positive result.  Discussed quarantine, infection prevention, and ER precautions.  Patient verbalized understanding.

## 2021-02-24 ENCOUNTER — Emergency Department
Admission: EM | Admit: 2021-02-24 | Discharge: 2021-02-24 | Disposition: A | Discharge status: Home / Self Care | Attending: Emergency Medicine | Admitting: Emergency Medicine

## 2021-02-24 ENCOUNTER — Other Ambulatory Visit: Payer: Self-pay

## 2021-02-24 DIAGNOSIS — F419 Anxiety disorder, unspecified: Secondary | ICD-10-CM | POA: Insufficient documentation

## 2021-02-24 LAB — CBC
HCT: 34.9 % — ABNORMAL LOW (ref 36.0–46.0)
Hemoglobin: 11.2 g/dL — ABNORMAL LOW (ref 12.0–15.0)
MCH: 28.5 pg (ref 26.0–34.0)
MCHC: 32.1 g/dL (ref 30.0–36.0)
MCV: 88.8 fL (ref 80.0–100.0)
Platelets: 244 10*3/uL (ref 150–400)
RBC: 3.93 MIL/uL (ref 3.87–5.11)
RDW: 12.9 % (ref 11.5–15.5)
WBC: 3.7 10*3/uL — ABNORMAL LOW (ref 4.0–10.5)
nRBC: 0 % (ref 0.0–0.2)

## 2021-02-24 LAB — URINE DRUG SCREEN, QUALITATIVE (ARMC ONLY)
Amphetamines, Ur Screen: NOT DETECTED
Barbiturates, Ur Screen: NOT DETECTED
Benzodiazepine, Ur Scrn: NOT DETECTED
Cannabinoid 50 Ng, Ur ~~LOC~~: NOT DETECTED
Cocaine Metabolite,Ur ~~LOC~~: NOT DETECTED
MDMA (Ecstasy)Ur Screen: NOT DETECTED
Methadone Scn, Ur: NOT DETECTED
Opiate, Ur Screen: NOT DETECTED
Phencyclidine (PCP) Ur S: NOT DETECTED
Tricyclic, Ur Screen: NOT DETECTED

## 2021-02-24 LAB — URINALYSIS, COMPLETE (UACMP) WITH MICROSCOPIC
Bilirubin Urine: NEGATIVE
Glucose, UA: NEGATIVE mg/dL
Hgb urine dipstick: NEGATIVE
Ketones, ur: 5 mg/dL — AB
Leukocytes,Ua: NEGATIVE
Nitrite: NEGATIVE
Protein, ur: NEGATIVE mg/dL
Specific Gravity, Urine: 1.02 (ref 1.005–1.030)
pH: 6 (ref 5.0–8.0)

## 2021-02-24 LAB — POC URINE PREG, ED: Preg Test, Ur: NEGATIVE

## 2021-02-24 LAB — BASIC METABOLIC PANEL
Anion gap: 7 (ref 5–15)
BUN: 8 mg/dL (ref 6–20)
CO2: 24 mmol/L (ref 22–32)
Calcium: 9.1 mg/dL (ref 8.9–10.3)
Chloride: 103 mmol/L (ref 98–111)
Creatinine, Ser: 0.77 mg/dL (ref 0.44–1.00)
GFR, Estimated: >60 mL/min (ref >60)
Glucose, Bld: 104 mg/dL — ABNORMAL HIGH (ref 70–99)
Potassium: 3.7 mmol/L (ref 3.5–5.1)
Sodium: 134 mmol/L — ABNORMAL LOW (ref 135–145)

## 2021-02-24 NOTE — ED Notes (Signed)
This RN went to collect urine sample from pt. Pt asked this RN if "psychiatry team is going to see me" and stated she believes the "episode" she experienced was due to her "meds being mixed up." EDP notified.

## 2021-02-24 NOTE — ED Notes (Signed)
Pt given water at this time. Pt denies further needs at this time.

## 2021-02-24 NOTE — ED Notes (Signed)
Pt continuously walking up to nurses station wondering the results. Pt educated that doctor will take a look when he can and has been currently busy in another patients room. Pt redirected that psychiatry team may take awhile to see her. Pt understands but still seems anxious about knowing results.

## 2021-02-24 NOTE — ED Triage Notes (Signed)
Pt to ED voluntarily due to increase in anxiety; just began taking medication last week. Reports Saturday night she drank a lot of alcohol and went to a guy's house and was knocking on door and crying but does not recall the event as portrayed by bystanders. Denies depression, denies SI or HI.

## 2021-02-24 NOTE — ED Provider Notes (Signed)
[facility] Emergency Department Provider Note  ____________________________________________  Time seen: Approximately 8:04 PM  I have reviewed the triage vital signs and the nursing notes.   HISTORY  Chief Complaint Anxiety    HPI Burgandy Hackworth is a 25 y.o. female with a past history of anxiety who comes ED complaining of continued anxiety.  She reports that she recently started a new anxiety medicine about 2 weeks ago, and then 3 days ago she drank heavily, and remembers going over to her boyfriend's house at night knocking on the door but there was no answer.  However, she was later told that neighbors observed her banging on the door and crying for 30 minutes.  She is worried that there might be something making her sick other than the alcohol intoxication of that night.  Currently denies fever dysuria chest pain shortness of breath vomiting or other medical complaints.  No SI or HI, feels safe at home, no hallucinations.  Still taking her medications as prescribed.  Reports that she has decided she will avoid alcohol.     History reviewed. No pertinent past medical history.   There are no problems to display for this patient.    History reviewed. No pertinent surgical history.   Prior to Admission medications   Medication Sig Start Date End Date Taking? Authorizing Provider  albuterol (PROVENTIL HFA;VENTOLIN HFA) 108 (90 Base) MCG/ACT inhaler Inhale 2 puffs into the lungs every 4 (four) hours as needed for wheezing or shortness of breath. Patient not taking: Reported on 02/24/2016 12/06/15   Tharon Aquas, PA  Levonorgestrel-Ethinyl Estradiol (AMETHIA,CAMRESE) 0.15-0.03 &0.01 MG tablet Take 1 tablet by mouth daily. 02/24/16   Trena Platt D, PA  cetirizine (ZYRTEC) 10 MG tablet Take 1 tablet (10 mg total) by mouth daily. 01/07/15 12/06/15  Charm Rings, MD     Allergies Patient has no known allergies.   No family history on  file.  Social History Social History   Tobacco Use  . Smoking status: Never Smoker  Substance Use Topics  . Alcohol use: Yes    Comment: occasionally  . Drug use: Yes    Types: Marijuana    Review of Systems  Constitutional:   No fever or chills.  ENT:   No sore throat. No rhinorrhea. Cardiovascular:   No chest pain or syncope. Respiratory:   No dyspnea or cough. Gastrointestinal:   Negative for abdominal pain, vomiting and diarrhea.  Musculoskeletal:   Negative for focal pain or swelling All other systems reviewed and are negative except as documented above in ROS and HPI.  ____________________________________________   PHYSICAL EXAM:  VITAL SIGNS: ED Triage Vitals [02/24/21 1740]  Enc Vitals Group     BP (!) 122/102     Pulse Rate 81     Resp 16     Temp 99 F (37.2 C)     Temp Source Oral     SpO2 97 %     Weight 127 lb 13.9 oz (58 kg)     Height 5\' 5"  (1.651 m)     Head Circumference      Peak Flow      Pain Score 0     Pain Loc      Pain Edu?      Excl. in GC?     Vital signs reviewed, nursing assessments reviewed.   Constitutional:   Alert and oriented. Non-toxic appearance.  Anxious appearing Eyes:   Conjunctivae are normal. EOMI. PERRL.  No nystagmus, proptosis, or lid lag ENT      Head:   Normocephalic and atraumatic.      Nose: Normal      Mouth/Throat: Normal      Neck:   No meningismus. Full ROM.  No goiter Hematological/Lymphatic/Immunilogical:   No cervical lymphadenopathy. Cardiovascular:   RRR. Symmetric bilateral radial and DP pulses.  No murmurs. Cap refill less than 2 seconds. Respiratory:   Normal respiratory effort without tachypnea/retractions. Breath sounds are clear and equal bilaterally. No wheezes/rales/rhonchi. Gastrointestinal:   Soft and nontender. Non distended. There is no CVA tenderness.  No rebound, rigidity, or guarding. Genitourinary:   deferred Musculoskeletal:   Normal range of motion in all extremities. No joint  effusions.  No lower extremity tenderness.  No edema. Neurologic:   Normal speech and language.  Motor grossly intact. No acute focal neurologic deficits are appreciated.  Skin:    Skin is warm, dry and intact. No rash noted.  No petechiae, purpura, or bullae.  ____________________________________________    LABS (pertinent positives/negatives) (all labs ordered are listed, but only abnormal results are displayed) Labs Reviewed  CBC - Abnormal; Notable for the following components:      Result Value   WBC 3.7 (*)    Hemoglobin 11.2 (*)    HCT 34.9 (*)    All other components within normal limits  BASIC METABOLIC PANEL - Abnormal; Notable for the following components:   Sodium 134 (*)    Glucose, Bld 104 (*)    All other components within normal limits  URINALYSIS, COMPLETE (UACMP) WITH MICROSCOPIC - Abnormal; Notable for the following components:   Color, Urine YELLOW (*)    APPearance HAZY (*)    Ketones, ur 5 (*)    Bacteria, UA RARE (*)    All other components within normal limits  URINE DRUG SCREEN, QUALITATIVE ([facility] ONLY)  POC URINE PREG, ED   ____________________________________________   EKG    ____________________________________________    RADIOLOGY  No results found.  ____________________________________________   PROCEDURES Procedures  ____________________________________________    CLINICAL IMPRESSION / ASSESSMENT AND PLAN / ED COURSE  Medications ordered in the ED: Medications - No data to display  Pertinent labs & imaging results that were available during my care of the patient were reviewed by me and considered in my medical decision making (see chart for details).  Othelia Collums was evaluated in Emergency Department on 02/24/2021 for the symptoms described in the history of present illness. She was evaluated in the context of the global COVID-19 pandemic, which necessitated consideration that the patient might be at risk for  infection with the SARS-CoV-2 virus that causes COVID-19. Institutional protocols and algorithms that pertain to the evaluation of patients at risk for COVID-19 are in a state of rapid change based on information released by regulatory bodies including the CDC and federal and state organizations. These policies and algorithms were followed during the patient's care in the ED.   Patient presents with symptoms of anxiety, had an episode of labile mood and poor short-term memory in the setting of alcohol intoxication.  Currently she is asymptomatic apart from her anxiety, vital signs are normal, exam is benign and reassuring and she is nontoxic.  She is lucid, not psychiatrically unstable.  Lab panel is normal.  Advised her to continue taking her medication as prescribed, avoid alcohol, follow-up with her mental health professional.      ____________________________________________   FINAL CLINICAL IMPRESSION(S) / ED DIAGNOSES  Final diagnoses:  Anxiety     ED Discharge Orders    None      Portions of this note were generated with dragon dictation software. Dictation errors may occur despite best attempts at proofreading.   Sharman Cheek, MD 02/24/21 2008

## 2021-02-24 NOTE — ED Notes (Signed)
Pt can be heard on phone speaking upon this RN arrival to bedside, pt states to whomever she is on the phone with that she was told she knocked on a friend's door for over 30 mins but does not recall, pt states she remembers knocking on the door but nothing after that. Pt states during phone call that she is here for her mental health and that a guy voiced concerns regarding her drinking and and not knowing if she is "pregnant with their child". Pt A&O x4, NAD noted at this time, pt continued phone conversation while this RN at bedside and drawing blood.

## 2021-12-29 ENCOUNTER — Emergency Department: Payer: No Typology Code available for payment source

## 2021-12-29 ENCOUNTER — Emergency Department
Admission: EM | Admit: 2021-12-29 | Discharge: 2021-12-31 | Disposition: A | Discharge status: Other Acute Inpatient Hospital | Payer: No Typology Code available for payment source | Attending: Emergency Medicine | Admitting: Emergency Medicine

## 2021-12-29 DIAGNOSIS — Y900 Blood alcohol level of less than 20 mg/100 ml: Secondary | ICD-10-CM | POA: Diagnosis not present

## 2021-12-29 DIAGNOSIS — E876 Hypokalemia: Secondary | ICD-10-CM | POA: Diagnosis not present

## 2021-12-29 DIAGNOSIS — F419 Anxiety disorder, unspecified: Secondary | ICD-10-CM | POA: Diagnosis not present

## 2021-12-29 DIAGNOSIS — F19959 Other psychoactive substance use, unspecified with psychoactive substance-induced psychotic disorder, unspecified: Secondary | ICD-10-CM | POA: Diagnosis present

## 2021-12-29 DIAGNOSIS — R748 Abnormal levels of other serum enzymes: Secondary | ICD-10-CM | POA: Diagnosis not present

## 2021-12-29 DIAGNOSIS — F10129 Alcohol abuse with intoxication, unspecified: Secondary | ICD-10-CM | POA: Insufficient documentation

## 2021-12-29 DIAGNOSIS — F12959 Cannabis use, unspecified with psychotic disorder, unspecified: Secondary | ICD-10-CM | POA: Diagnosis not present

## 2021-12-29 DIAGNOSIS — R4182 Altered mental status, unspecified: Secondary | ICD-10-CM | POA: Diagnosis not present

## 2021-12-29 DIAGNOSIS — F22 Delusional disorders: Secondary | ICD-10-CM | POA: Diagnosis not present

## 2021-12-29 DIAGNOSIS — Z20822 Contact with and (suspected) exposure to covid-19: Secondary | ICD-10-CM | POA: Insufficient documentation

## 2021-12-29 LAB — COMPREHENSIVE METABOLIC PANEL
ALT: 21 U/L (ref 0–44)
AST: 44 U/L — ABNORMAL HIGH (ref 15–41)
Albumin: 4.4 g/dL (ref 3.5–5.0)
Alkaline Phosphatase: 72 U/L (ref 38–126)
Anion gap: 11 (ref 5–15)
BUN: 12 mg/dL (ref 6–20)
CO2: 22 mmol/L (ref 22–32)
Calcium: 8.9 mg/dL (ref 8.9–10.3)
Chloride: 103 mmol/L (ref 98–111)
Creatinine, Ser: 0.84 mg/dL (ref 0.44–1.00)
GFR, Estimated: >60 mL/min (ref >60)
Glucose, Bld: 115 mg/dL — ABNORMAL HIGH (ref 70–99)
Potassium: 2.9 mmol/L — ABNORMAL LOW (ref 3.5–5.1)
Sodium: 136 mmol/L (ref 135–145)
Total Bilirubin: 1.5 mg/dL — ABNORMAL HIGH (ref 0.3–1.2)
Total Protein: 8 g/dL (ref 6.5–8.1)

## 2021-12-29 LAB — CBC
HCT: 30.4 % — ABNORMAL LOW (ref 36.0–46.0)
Hemoglobin: 9.8 g/dL — ABNORMAL LOW (ref 12.0–15.0)
MCH: 28.6 pg (ref 26.0–34.0)
MCHC: 32.2 g/dL (ref 30.0–36.0)
MCV: 88.6 fL (ref 80.0–100.0)
Platelets: 192 10*3/uL (ref 150–400)
RBC: 3.43 MIL/uL — ABNORMAL LOW (ref 3.87–5.11)
RDW: 12.9 % (ref 11.5–15.5)
WBC: 5.5 10*3/uL (ref 4.0–10.5)
nRBC: 0 % (ref 0.0–0.2)

## 2021-12-29 LAB — SALICYLATE LEVEL: Salicylate Lvl: <7 mg/dL — ABNORMAL LOW (ref 7.0–30.0)

## 2021-12-29 LAB — ETHANOL: Alcohol, Ethyl (B): <10 mg/dL (ref <10)

## 2021-12-29 LAB — ACETAMINOPHEN LEVEL: Acetaminophen (Tylenol), Serum: <10 ug/mL — ABNORMAL LOW (ref 10–30)

## 2021-12-29 LAB — HCG, QUANTITATIVE, PREGNANCY: hCG, Beta Chain, Quant, S: <1 m[IU]/mL (ref <5)

## 2021-12-29 MED ORDER — SODIUM CHLORIDE 0.9 % IV BOLUS
1000.0000 mL | Freq: Once | INTRAVENOUS | Status: AC
  Administered 2021-12-29: 1000 mL via INTRAVENOUS

## 2021-12-29 MED ORDER — LORAZEPAM 2 MG/ML IJ SOLN
1.0000 mg | Freq: Once | INTRAMUSCULAR | Status: AC
  Administered 2021-12-29: 1 mg via INTRAVENOUS
  Filled 2021-12-29: qty 1

## 2021-12-29 NOTE — ED Triage Notes (Addendum)
Per EMS , pt's sister called for this pt d/t AMS . Per report Pt smoked weed throughout the day today. Per report,  Patient took some edibles over the weekend and had AMS thereafter over the weekend. Symptoms imroved and startde again today.   Pt presents to ED AA, confused . Respi even-unlabored ?

## 2021-12-29 NOTE — ED Provider Notes (Signed)
? ?[facility] ?Provider Note ? ? ? Event Date/Time  ? First MD Initiated Contact with Patient 12/29/21 2306   ?  (approximate) ? ? ?History  ? ?Altered Mental Status ? ? ?HPI ? ?Level V caveat: Limited by altered mentation ? ?Robin Ross is a 26 y.o. female brought to the ED via EMS from home with a chief complaint of altered mental status.  Patient's sister called EMS.  Reportedly patient smoked weed throughout the day.  Additionally patient took some edibles over the weekend and she herself endorses using mushrooms.  Sister reports altered mentation over the weekend as well.  Patient arrives confused, intoxicated, uncooperative.  Rest of history is unobtainable secondary to altered mentation. ?  ? ? ?Past Medical History  ?No past medical history on file. ? ? ?Active Problem List  ?There are no problems to display for this patient. ? ? ? ?Past Surgical History  ?No past surgical history on file. ? ? ?Home Medications  ? ?Prior to Admission medications   ?Medication Sig Start Date End Date Taking? Authorizing Provider  ?albuterol (PROVENTIL HFA;VENTOLIN HFA) 108 (90 Base) MCG/ACT inhaler Inhale 2 puffs into the lungs every 4 (four) hours as needed for wheezing or shortness of breath. ?Patient not taking: Reported on 02/24/2016 12/06/15   Konrad Felix, PA  ?Levonorgestrel-Ethinyl Estradiol (AMETHIA,CAMRESE) 0.15-0.03 &0.01 MG tablet Take 1 tablet by mouth daily. 02/24/16   Ivar Drape D, PA  ?cetirizine (ZYRTEC) 10 MG tablet Take 1 tablet (10 mg total) by mouth daily. 01/07/15 12/06/15  Melony Overly, MD  ? ? ? ?Allergies  ?Patient has no known allergies. ? ? ?Family History  ?No family history on file. ? ? ?Physical Exam  ?Triage Vital Signs: ?ED Triage Vitals  ?Enc Vitals Group  ?   BP 12/29/21 2253 139/75  ?   Pulse Rate 12/29/21 2253 90  ?   Resp 12/29/21 2253 15  ?   Temp 12/29/21 2255 99.2 ?F (37.3 ?C)  ?   Temp Source 12/29/21 2255 Oral  ?   SpO2 12/29/21 2253 99 %  ?    Weight 12/29/21 2254 121 lb 4.1 oz (55 kg)  ?   Height 12/29/21 2254 5\' 1"  (1.549 m)  ?   Head Circumference --   ?   Peak Flow --   ?   Pain Score --   ?   Pain Loc --   ?   Pain Edu? --   ?   Excl. in [place]? --   ? ? ?Updated Vital Signs: ?BP 130/80   Pulse 98   Temp 99.2 ?F (37.3 ?C) (Oral)   Resp 19   Ht 5\' 1"  (1.549 m)   Wt 55 kg   SpO2 100%   BMI 22.91 kg/m?  ? ? ?General: Awake, intoxicated/under the influence of substance.  ?CV:  RRR. Good peripheral perfusion.  ?Resp:  Normal effort. CTAB. ?Abd:  Nontender. No distention.  ?Other:  Confused, uncooperative. MAEx4. ? ? ?ED Results / Procedures / Treatments  ?Labs ?(all labs ordered are listed, but only abnormal results are displayed) ?Labs Reviewed  ?COMPREHENSIVE METABOLIC PANEL - Abnormal; Notable for the following components:  ?    Result Value  ? Potassium 2.9 (*)   ? Glucose, Bld 115 (*)   ? AST 44 (*)   ? Total Bilirubin 1.5 (*)   ? All other components within normal limits  ?SALICYLATE LEVEL - Abnormal; Notable for the following components:  ?  Salicylate Lvl <7.0 (*)   ? All other components within normal limits  ?ACETAMINOPHEN LEVEL - Abnormal; Notable for the following components:  ? Acetaminophen (Tylenol), Serum <10 (*)   ? All other components within normal limits  ?CBC - Abnormal; Notable for the following components:  ? RBC 3.43 (*)   ? Hemoglobin 9.8 (*)   ? HCT 30.4 (*)   ? All other components within normal limits  ?CK - Abnormal; Notable for the following components:  ? Total CK 572 (*)   ? All other components within normal limits  ?ETHANOL  ?HCG, QUANTITATIVE, PREGNANCY  ?URINE DRUG SCREEN, QUALITATIVE ([facility] ONLY)  ?URINALYSIS, ROUTINE W REFLEX MICROSCOPIC  ? ? ? ?EKG ? ?ED ECG REPORT ?I, Irean Hong, the attending physician, personally viewed and interpreted this ECG. ? ? Date: 12/29/2021 ? EKG Time: 2305 ? Rate: 90 ? Rhythm: normal sinus rhythm ? Axis: Normal ? Intervals:none ? ST&T Change: Nonspecific ? ? ? ?RADIOLOGY ?I have  independently visualized and reviewed patient's CT head, chest x-ray as well as noted the radiology interpretation: ? ?CT head: No ICH ? ?Chest x-ray: No acute cardiopulmonary process ? ?Official radiology report(s): ?CT Head Wo Contrast ? ?Result Date: 12/30/2021 ?CLINICAL DATA:  Altered mental status, drug use EXAM: CT HEAD WITHOUT CONTRAST TECHNIQUE: Contiguous axial images were obtained from the base of the skull through the vertex without intravenous contrast. RADIATION DOSE REDUCTION: This exam was performed according to the departmental dose-optimization program which includes automated exposure control, adjustment of the mA and/or kV according to patient size and/or use of iterative reconstruction technique. COMPARISON:  None. FINDINGS: Brain: No evidence of acute infarction, hemorrhage, hydrocephalus, extra-axial collection or mass lesion/mass effect. Vascular: No hyperdense vessel or unexpected calcification. Skull: Normal. Negative for fracture or focal lesion. Sinuses/Orbits: The visualized paranasal sinuses are essentially clear. The mastoid air cells are unopacified. Other: None. IMPRESSION: Normal head CT. Electronically Signed   By: Charline Bills M.D.   On: 12/30/2021 00:21  ? ?DG Chest Port 1 View ? ?Result Date: 12/30/2021 ?CLINICAL DATA:  AMS EXAM: PORTABLE CHEST 1 VIEW COMPARISON:  Chest x-ray 09/01/2014 FINDINGS: The heart and mediastinal contours are within normal limits. No focal consolidation. No pulmonary edema. No pleural effusion. No pneumothorax. No acute osseous abnormality. IMPRESSION: No active disease. Electronically Signed   By: Tish Frederickson M.D.   On: 12/30/2021 00:47   ? ? ?PROCEDURES: ? ?Critical Care performed: Yes, see critical care procedure note(s) ? ?CRITICAL CARE ?Performed by: Irean Hong ? ? ?Total critical care time: 30 minutes ? ?Critical care time was exclusive of separately billable procedures and treating other patients. ? ?Critical care was necessary to treat or  prevent imminent or life-threatening deterioration. ? ?Critical care was time spent personally by me on the following activities: development of treatment plan with patient and/or surrogate as well as nursing, discussions with consultants, evaluation of patient's response to treatment, examination of patient, obtaining history from patient or surrogate, ordering and performing treatments and interventions, ordering and review of laboratory studies, ordering and review of radiographic studies, pulse oximetry and re-evaluation of patient's condition. ? ? ?.1-3 Lead EKG Interpretation ?Performed by: Irean Hong, MD ?Authorized by: Irean Hong, MD  ? ?  Interpretation: normal   ?  ECG rate:  90 ?  ECG rate assessment: normal   ?  Rhythm: sinus rhythm   ?  Ectopy: none   ?  Conduction: normal   ?Comments:  ?  Patient placed on cardiac monitor to evaluate for arrhythmias ? ? ?MEDICATIONS ORDERED IN ED: ?Medications  ?sodium chloride 0.9 % bolus 1,000 mL (1,000 mLs Intravenous New Bag/Given 12/29/21 2318)  ?LORazepam (ATIVAN) injection 1 mg (1 mg Intravenous Given 12/29/21 2318)  ? ? ? ?IMPRESSION / MDM / ASSESSMENT AND PLAN / ED COURSE  ?I reviewed the triage vital signs and the nursing notes. ?             ?               ?26 year old female presenting with altered mentation in the setting of recent drug use. Differential diagnosis includes, but is not limited to, alcohol, illicit or prescription medications, or other toxic ingestion; intracranial pathology such as stroke or intracerebral hemorrhage; fever or infectious causes including sepsis; hypoxemia and/or hypercarbia; uremia; trauma; endocrine related disorders such as diabetes, hypoglycemia, and thyroid-related diseases; hypertensive encephalopathy; etc. I have personally reviewed patient's records and see a telemedicine visit for anxiety/depression on 08/28/2021. ? ?The patient is on the cardiac monitor to evaluate for evidence of arrhythmia and/or significant  heart rate changes. ? ?Will obtain toxicological lab work and urinalysis; obtain CT head, chest x-ray.  Initiate IV fluid resuscitation, IV Ativan for calming.  Will place patient under IVC for her safety.  We will m

## 2021-12-29 NOTE — ED Notes (Signed)
Pt resting at this time pt given 2 more warm blankets. Pt hooked up to blood pressure, pulse and pulse oxygen.VSS ?

## 2021-12-29 NOTE — ED Notes (Signed)
Security called and at bedside for this  Pt after Pt attempts to ran out of ED  ?

## 2021-12-30 ENCOUNTER — Emergency Department: Payer: No Typology Code available for payment source

## 2021-12-30 DIAGNOSIS — F19959 Other psychoactive substance use, unspecified with psychoactive substance-induced psychotic disorder, unspecified: Secondary | ICD-10-CM

## 2021-12-30 LAB — URINE DRUG SCREEN, QUALITATIVE (ARMC ONLY)
Amphetamines, Ur Screen: NOT DETECTED
Barbiturates, Ur Screen: NOT DETECTED
Benzodiazepine, Ur Scrn: POSITIVE — AB
Cannabinoid 50 Ng, Ur ~~LOC~~: POSITIVE — AB
Cocaine Metabolite,Ur ~~LOC~~: NOT DETECTED
MDMA (Ecstasy)Ur Screen: NOT DETECTED
Methadone Scn, Ur: NOT DETECTED
Opiate, Ur Screen: NOT DETECTED
Phencyclidine (PCP) Ur S: NOT DETECTED
Tricyclic, Ur Screen: NOT DETECTED

## 2021-12-30 LAB — URINALYSIS, ROUTINE W REFLEX MICROSCOPIC
Bilirubin Urine: NEGATIVE
Glucose, UA: NEGATIVE mg/dL
Hgb urine dipstick: NEGATIVE
Ketones, ur: 80 mg/dL — AB
Leukocytes,Ua: NEGATIVE
Nitrite: NEGATIVE
Protein, ur: NEGATIVE mg/dL
Specific Gravity, Urine: 1.009 (ref 1.005–1.030)
pH: 6 (ref 5.0–8.0)

## 2021-12-30 LAB — CK: Total CK: 572 U/L — ABNORMAL HIGH (ref 38–234)

## 2021-12-30 LAB — RESP PANEL BY RT-PCR (FLU A&B, COVID) ARPGX2
Influenza A by PCR: NEGATIVE
Influenza B by PCR: NEGATIVE
SARS Coronavirus 2 by RT PCR: NEGATIVE

## 2021-12-30 MED ORDER — OLANZAPINE 5 MG PO TABS
2.5000 mg | ORAL_TABLET | Freq: Once | ORAL | Status: AC
  Administered 2021-12-30: 2.5 mg via ORAL
  Filled 2021-12-30: qty 1

## 2021-12-30 MED ORDER — LORAZEPAM 2 MG/ML IJ SOLN
2.0000 mg | Freq: Once | INTRAMUSCULAR | Status: AC
  Administered 2021-12-30: 2 mg via INTRAMUSCULAR
  Filled 2021-12-30: qty 1

## 2021-12-30 NOTE — ED Notes (Signed)
Pts family (sister) in room and updated on plan of care as well as policies and procedures.  ?

## 2021-12-30 NOTE — BH Assessment (Signed)
PATIENT BED AVAILABLE AFTER 8AM for 12/31/21 ? ?Patient has been accepted to [facility].  ?Patient assigned to [facility] ?Accepting physician is Dr. Estill Cotta.  ?Call report to [phone].  ?Representative was Lurena Joiner.  ? ?ER Staff is aware of it:  ?Orthoptist  ?Dr. Fuller Plan, ER MD  ?Florentina Addison Patient's Nurse ? ?Attempted to contact patient's sister Zella Dewan [phone] to update regarding transfer but no answer and voicemail is full ?

## 2021-12-30 NOTE — ED Notes (Signed)
Psychiatrist at bedside with pt and pt's sister ?

## 2021-12-30 NOTE — ED Notes (Signed)
Resumed care from brandy rn  pt sleeping  ?

## 2021-12-30 NOTE — ED Notes (Signed)
Pt asking about phone again. Pt informed of phone hours. Pt is difficult to redirect concerning phone hours, when the psychiatrist is coming, when family is coming.  ?

## 2021-12-30 NOTE — ED Notes (Signed)
When getting pt to the toilet, pt missed the HAT (urine catcher) when using the restroom. Md notified ?

## 2021-12-30 NOTE — ED Notes (Signed)
Upon pt calling out, pt got up out of bed on to floor saying "help I need help". Pt was greeted by multiple RN's pt family member stated pt did not fall as well as the security guard that witnessed the pt getting out of bed. Pt was helped off of the floor into a wheelchair and put into 19H with bed alarm on.  ?

## 2021-12-30 NOTE — ED Notes (Addendum)
Pt asking about using the phone again, Pt informed on phone hours again. Pt also asking to see psychiatrist, Pt informed that they will be making rounds today but it is unknown what time they will be in the ER to see Pt. Pt then asks security the same questions. Pt sitting in bed quietly at this time.  ? ?

## 2021-12-30 NOTE — ED Notes (Signed)
Sister came to visit again and pt is sleeping and not allowed back again today. Family informed they can revisit patient again tomorrow.   ?

## 2021-12-30 NOTE — ED Notes (Signed)
Pt given her lunch tray.

## 2021-12-30 NOTE — ED Notes (Signed)
Pt sister took home pt cell phone. ?

## 2021-12-30 NOTE — ED Notes (Signed)
IVC/ Consult ordered/ Pending/ Still Medical at this time ?

## 2021-12-30 NOTE — ED Notes (Signed)
Pt awake and asking about "a pill" she was supposed to be getting. This RN reviewed pts record and there is no medications ordered at this time. Pt states that she thinks she was supposed to be having a prescription sent in to the pharmacy. Pt asking about her family and when they are coming back. Pt informed that it is not visitation hours at this time. Pt asking to use the phone, pt informed that it is not currently phone call hours.  ?

## 2021-12-30 NOTE — ED Notes (Signed)
Pt standing from her bed, states that she needs to use the bathroom, pt starts to walk to the bathroom hunched over and starts to cry appear to need assistance walking to the bathroom, ed tech emma assisting pt who then states that she has bad anxiety and the big scary men are looking at her, pt is referencing the security officers she has been speaking with prior to standing ?

## 2021-12-30 NOTE — ED Notes (Signed)
Family member at bedside with pt, visitor brought the pt her glasses ?
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
## 2021-12-30 NOTE — ED Notes (Signed)
INVOLUNTARY and inpatient admit recommended/pending placement ?

## 2021-12-30 NOTE — ED Notes (Signed)
Pt given meal tray.

## 2021-12-30 NOTE — ED Notes (Signed)
This RN over to speak with pt again. Pt asking when her sister left. Pt was informed that I was not aware of the time that the sister left and that night shift RN went home already. Pt asking to use the phone to call her mother. Pt informed that she could use the phone during phone call hours, which start at 9 am. Pt informed that we are currently waiting on her to be evaluated. Pt again asked to call her mother and was again informed she may call her during phone call hours. Pt states "well can someone bring me a phone then". Pt informed we would bring her a phone during phone call hours which start at 9 am.  ?

## 2021-12-30 NOTE — ED Notes (Signed)
Pt given her breakfast tray, speaking with security and asking repetitive questions about her arrival, her family, using the phone, pt also requesting a sign language interpreter ?

## 2021-12-30 NOTE — ED Notes (Signed)
Pt asking to use the phone to call her mother, Pt informed of phone hours. Pt accepting at this time.  ?

## 2021-12-30 NOTE — ED Notes (Signed)
Pt scooted to the end of bed and proceeded to stand and walk toward door. Pt was still connected to BP cuff, O2 and IV saline  ?Security and tech assisted pt in two assist hold and tried to get pt back in bed. Pt would wobble around and act like she was going to sit down on floor. Pt asked if she needed to go to the bathroom. She said yes and would never walk with the assistance toward the toilet. PT was finally directed into the bed and covered back up. POSE alarm in place and on.  ?

## 2021-12-30 NOTE — BH Assessment (Signed)
Adult MH  Referral information for Psychiatric Hospitalization faxed to:   [facility] ([phone]-or- [phone]),   [facility] ([phone]),   [facility] ([phone] -or- [phone]),   [facility] ([phone]---[phone]---[phone]),   [facility] ([phone] or [phone])   [facility] ([phone] or [phone])   [facility] ([phone] or [phone]),   [facility] ([phone]). 

## 2021-12-30 NOTE — ED Notes (Signed)
Pt given phone to call mother

## 2021-12-30 NOTE — ED Notes (Addendum)
PER TTS PT IS ACCEPTED TO [facility] AFTER 8AM TOMORROW 12/30/2021 ?Covid swab order in per [facility] request ?

## 2021-12-30 NOTE — ED Notes (Signed)
Per verbal order of dr sung pt IV removed .  ?

## 2021-12-30 NOTE — ED Notes (Signed)
Pt wanted to let this RN know that she can definitely feel a difference in the medication that she took today vs the medication she has taken in the past and states that she thinks this is working ?

## 2021-12-30 NOTE — ED Notes (Signed)
Pt sleeping, covers over her head   pt in hallway bed.  ?
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
## 2021-12-30 NOTE — ED Notes (Signed)
This tech assisted Pt to bathroom, Pt with unsteady gait, reference Brandy, RN's note below. Once to the bathroom, Pt is able to ambulate, stand, and sit on toilet without assistance. Pt is also able to ambulate to sink and wash hands without assistance. This tech remained in bathroom with Pt due to fall risk. Pt's unsteady gait resumed after visit to bathroom was complete. Pt ambulated with this tech about 10 feet, then lowered herself to the floor. This tech continued to support Pt as she lowered to the floor. Brandy, RN to Pt's side to assist this tech in standing Pt up. Pt ambulated with assistance back to 19H. Pt sitting quietly in bed at this time.  ?

## 2021-12-30 NOTE — ED Notes (Signed)
Father rodney Risse [phone].  Call him for any questions per father.  ?

## 2021-12-30 NOTE — ED Notes (Signed)
Pt given breakfast, requesting ASL interpreter. Gearldine Bienenstock, RN aware. ?

## 2021-12-31 NOTE — ED Provider Notes (Signed)
Emergency Medicine Observation Re-evaluation Note ? ?Avah Boger is a 26 y.o. female, seen on rounds today.   ? ?Physical Exam  ?BP (!) 130/50 (BP Location: Right Arm)   Pulse 84   Temp 98.7 ?F (37.1 ?C) (Oral)   Resp 18   Ht 5\' 1"  (1.549 m)   Wt 55 kg   SpO2 98%   BMI 22.91 kg/m?  ?Physical Exam ?General: Patient resting comfortably in bed ?Lungs: Patient in no respiratory distress ?Psych: Patient not combative ? ?ED Course / MDM  ?EKG:EKG Interpretation ? ?Date/Time:  Tuesday December 29 2021 23:05:04 EDT ?Ventricular Rate:  90 ?PR Interval:  118 ?QRS Duration: 86 ?QT Interval:  379 ?QTC Calculation: 464 ?R Axis:   72 ?Text Interpretation: Sinus rhythm Borderline short PR interval Confirmed by UNCONFIRMED, DOCTOR (05-29-2008), editor [id number] [phone]) on 12/30/2021 10:09:08 AM ? ? ? ?Plan  ?Current plan is for psychiatric evaluation and treatment ?Lekesha Tagliaferro is under involuntary commitment. ?  ? ?  ?Cloria Spring, MD ?12/31/21 [phone] ? ?

## 2021-12-31 NOTE — ED Notes (Signed)
Called [facility] for transport by [facility] to [facility]  [phone] ?

## 2021-12-31 NOTE — ED Notes (Signed)
Pt given cup of water 

## 2021-12-31 NOTE — ED Notes (Signed)
Transported to [facility] via [employer]  ?

## 2021-12-31 NOTE — ED Notes (Signed)
Sister called for update, given by RN with permission of patient. Pt sister requesting that pt call her at phone hours.  ?

## 2021-12-31 NOTE — ED Notes (Signed)
Pt used phone to call mother prior to transfer ?

## 2022-01-27 ENCOUNTER — Other Ambulatory Visit: Payer: Self-pay

## 2022-01-27 ENCOUNTER — Encounter: Payer: Self-pay | Admitting: *Deleted

## 2022-01-27 ENCOUNTER — Emergency Department
Admission: EM | Admit: 2022-01-27 | Discharge: 2022-01-27 | Disposition: A | Discharge status: Home / Self Care | Payer: No Typology Code available for payment source | Attending: Emergency Medicine | Admitting: Emergency Medicine

## 2022-01-27 DIAGNOSIS — F1210 Cannabis abuse, uncomplicated: Secondary | ICD-10-CM | POA: Diagnosis not present

## 2022-01-27 DIAGNOSIS — R4182 Altered mental status, unspecified: Secondary | ICD-10-CM | POA: Insufficient documentation

## 2022-01-27 DIAGNOSIS — R569 Unspecified convulsions: Secondary | ICD-10-CM | POA: Diagnosis not present

## 2022-01-27 LAB — COMPREHENSIVE METABOLIC PANEL
ALT: 151 U/L — ABNORMAL HIGH (ref 0–44)
AST: 67 U/L — ABNORMAL HIGH (ref 15–41)
Albumin: 4 g/dL (ref 3.5–5.0)
Alkaline Phosphatase: 81 U/L (ref 38–126)
Anion gap: 8 (ref 5–15)
BUN: 13 mg/dL (ref 6–20)
CO2: 24 mmol/L (ref 22–32)
Calcium: 9.2 mg/dL (ref 8.9–10.3)
Chloride: 106 mmol/L (ref 98–111)
Creatinine, Ser: 0.86 mg/dL (ref 0.44–1.00)
GFR, Estimated: >60 mL/min (ref >60)
Glucose, Bld: 109 mg/dL — ABNORMAL HIGH (ref 70–99)
Potassium: 3.5 mmol/L (ref 3.5–5.1)
Sodium: 138 mmol/L (ref 135–145)
Total Bilirubin: 0.7 mg/dL (ref 0.3–1.2)
Total Protein: 7.8 g/dL (ref 6.5–8.1)

## 2022-01-27 LAB — CBC WITH DIFFERENTIAL/PLATELET
Abs Immature Granulocytes: 0.01 10*3/uL (ref 0.00–0.07)
Basophils Absolute: 0 10*3/uL (ref 0.0–0.1)
Basophils Relative: 1 %
Eosinophils Absolute: 0.1 10*3/uL (ref 0.0–0.5)
Eosinophils Relative: 2 %
HCT: 31.7 % — ABNORMAL LOW (ref 36.0–46.0)
Hemoglobin: 9.7 g/dL — ABNORMAL LOW (ref 12.0–15.0)
Immature Granulocytes: 0 %
Lymphocytes Relative: 35 %
Lymphs Abs: 1.4 10*3/uL (ref 0.7–4.0)
MCH: 28.2 pg (ref 26.0–34.0)
MCHC: 30.6 g/dL (ref 30.0–36.0)
MCV: 92.2 fL (ref 80.0–100.0)
Monocytes Absolute: 0.5 10*3/uL (ref 0.1–1.0)
Monocytes Relative: 12 %
Neutro Abs: 1.9 10*3/uL (ref 1.7–7.7)
Neutrophils Relative %: 50 %
Platelets: 223 10*3/uL (ref 150–400)
RBC: 3.44 MIL/uL — ABNORMAL LOW (ref 3.87–5.11)
RDW: 13.2 % (ref 11.5–15.5)
WBC: 3.8 10*3/uL — ABNORMAL LOW (ref 4.0–10.5)
nRBC: 0 % (ref 0.0–0.2)

## 2022-01-27 LAB — URINALYSIS, ROUTINE W REFLEX MICROSCOPIC
Bilirubin Urine: NEGATIVE
Glucose, UA: NEGATIVE mg/dL
Hgb urine dipstick: NEGATIVE
Ketones, ur: NEGATIVE mg/dL
Leukocytes,Ua: NEGATIVE
Nitrite: NEGATIVE
Protein, ur: NEGATIVE mg/dL
Specific Gravity, Urine: 1.001 — ABNORMAL LOW (ref 1.005–1.030)
pH: 6 (ref 5.0–8.0)

## 2022-01-27 LAB — URINE DRUG SCREEN, QUALITATIVE (ARMC ONLY)
Amphetamines, Ur Screen: NOT DETECTED
Barbiturates, Ur Screen: NOT DETECTED
Benzodiazepine, Ur Scrn: NOT DETECTED
Cannabinoid 50 Ng, Ur ~~LOC~~: POSITIVE — AB
Cocaine Metabolite,Ur ~~LOC~~: NOT DETECTED
MDMA (Ecstasy)Ur Screen: NOT DETECTED
Methadone Scn, Ur: NOT DETECTED
Opiate, Ur Screen: NOT DETECTED
Phencyclidine (PCP) Ur S: NOT DETECTED
Tricyclic, Ur Screen: NOT DETECTED

## 2022-01-27 LAB — POC URINE PREG, ED: Preg Test, Ur: NEGATIVE

## 2022-01-27 NOTE — ED Provider Notes (Signed)
? ?[facility] ?Provider Note ? ? ? Event Date/Time  ? First MD Initiated Contact with Patient 01/27/22 [phone]   ?  (approximate) ? ? ?History  ? ?Seizures ? ? ?HPI ? ?Robin Ross is a 26 y.o. female who presents for evaluation of altered mental status.  History is gathered from patient and her sister who is at bedside.  Patient has a history of anxiety and depression.  Family history of bipolar disorder.  Also has a history of polysubstance abuse including alcohol, marijuana, ecstasy, mushrooms.  Patient was seen twice in the last month for altered mental status and possible seizure-like activity.  She was hospitalized at [facility] for acute onset of psychosis.  She is currently on Zyprexa.  According to the sister today patient was acting weird.  She kept tapping in her head and repeating her name.  Over the last several days since returning home from her [facility] hospitalization patient has episodes where she is very confused.  She does not know where she is, she looks like she is afraid to, she will recognize her sister.  She will open and close doors repeatedly.  She has not been driving.  According to the sister she does not think she has done any drugs other than marijuana.  She has not done anything dangerous that is threatening to her life for the life of others that has been witnessed by her sister.  She recently establish care with neurology 2 weeks ago.  She had a head CT which was unremarkable.  An EEG was recommended and neurology is also referring her for outpatient psychiatric evaluation.  She had a 30-minute EEG today and is scheduled to have a 3-hour EEG next week.  She is currently back to baseline according to her sister.  She never lost consciousness.  She was responsive and following commands throughout the whole episode. ?  ? ? ?No past medical history on file. ? ?No past surgical history on file. ? ? ?Physical Exam  ? ?Triage Vital Signs: ?ED Triage Vitals  ?Enc  Vitals Group  ?   BP 01/27/22 0056 135/86  ?   Pulse Rate 01/27/22 0056 77  ?   Resp 01/27/22 0056 20  ?   Temp 01/27/22 0056 98.7 ?F (37.1 ?C)  ?   Temp Source 01/27/22 0056 Oral  ?   SpO2 01/27/22 0056 97 %  ?   Weight 01/27/22 0057 121 lb (54.9 kg)  ?   Height 01/27/22 0057 5\' 1"  (1.549 m)  ?   Head Circumference --   ?   Peak Flow --   ?   Pain Score 01/27/22 0057 0  ?   Pain Loc --   ?   Pain Edu? --   ?   Excl. in [place]? --   ? ? ?Most recent vital signs: ?Vitals:  ? 01/27/22 0056 01/27/22 0400  ?BP: 135/86 115/74  ?Pulse: 77 69  ?Resp: 20 14  ?Temp: 98.7 ?F (37.1 ?C)   ?SpO2: 97% 100%  ? ? ? ?Constitutional: Alert and oriented. Well appearing and in no apparent distress. ?HEENT: ?     Head: Normocephalic and atraumatic.    ?     Eyes: Conjunctivae are normal. Sclera is non-icteric.  ?     Mouth/Throat: Mucous membranes are moist.  ?     Neck: Supple with no signs of meningismus. ?Cardiovascular: Regular rate and rhythm. No murmurs, gallops, or rubs. 2+ symmetrical distal pulses  are present in all extremities.  ?Respiratory: Normal respiratory effort. Lungs are clear to auscultation bilaterally.  ?Gastrointestinal: Soft, non tender, and non distended with positive bowel sounds. No rebound or guarding. ?Genitourinary: No CVA tenderness. ?Musculoskeletal:  No edema, cyanosis, or erythema of extremities. ?Neurologic: Normal speech and language. Face is symmetric. Moving all extremities. No gross focal neurologic deficits are appreciated. ?Skin: Skin is warm, dry and intact. No rash noted. ?Psychiatric: Mood and affect are normal. Speech and behavior are normal. ? ?ED Results / Procedures / Treatments  ? ?Labs ?(all labs ordered are listed, but only abnormal results are displayed) ?Labs Reviewed  ?COMPREHENSIVE METABOLIC PANEL - Abnormal; Notable for the following components:  ?    Result Value  ? Glucose, Bld 109 (*)   ? AST 67 (*)   ? ALT 151 (*)   ? All other components within normal limits  ?CBC WITH  DIFFERENTIAL/PLATELET - Abnormal; Notable for the following components:  ? WBC 3.8 (*)   ? RBC 3.44 (*)   ? Hemoglobin 9.7 (*)   ? HCT 31.7 (*)   ? All other components within normal limits  ?URINALYSIS, ROUTINE W REFLEX MICROSCOPIC - Abnormal; Notable for the following components:  ? Color, Urine COLORLESS (*)   ? APPearance CLEAR (*)   ? Specific Gravity, Urine 1.001 (*)   ? All other components within normal limits  ?URINE DRUG SCREEN, QUALITATIVE ([facility] ONLY) - Abnormal; Notable for the following components:  ? Cannabinoid 50 Ng, Ur [place] POSITIVE (*)   ? All other components within normal limits  ?POC URINE PREG, ED  ? ? ? ?EKG ? ?none ? ? ?RADIOLOGY ?none ? ? ?PROCEDURES: ? ?Critical Care performed: No ? ?Procedures ? ? ? ?IMPRESSION / MDM / ASSESSMENT AND PLAN / ED COURSE  ?I reviewed the triage vital signs and the nursing notes. ? ?26 y.o. female who presents for evaluation of altered mental status.  Patient with an episode tonight of confusion, not recognizing her sister, tapping her head and repeating her name.  Sister describes some jerking movements that was bilaterally including both upper and lower extremities but patient never lost consciousness, follow commands the whole time.  patient remembers the full episode.  On exam she is alert and oriented x3, neurologically intact with normal vital signs with a nonfocal exam.  I did an extensive review of patient's medical record and patient is currently being evaluated by neurology for possible new onset of seizure disorder.  She is recently hospitalized for a psychotic episode and is currently on olanzapine.  According to the sister she forgot to take olanzapine tonight.  Based on the description of the episode it does not sound like patient had a seizure.  May be another psychotic episode.  Currently she is back to baseline.  She is not a threat to herself or others.  I did recommend consulting psychiatry here but sister feels that she wants to take her home  so she can rest.  They do have outpatient psychiatry and neurology close follow-up.  She was observed in the ER for 3.5 hours with no further episodes.  Her blood work does not show an electrolyte derangements, stable mild anemia and leukopenia.  UDS positive for cannabinoids.  She does have mildly elevated LFTs which seem to be trending up when compared to labs from a month ago.  Possibly from olanzapine.  Discussed this finding with patient and her sister and recommended that they call the neurologist and their  psychiatrist first thing in the morning to address this issue.  It could also be due to substance abuse since patient has known history of alcohol use and other drugs as well.  I did not feel at this point the patient needed admission or involuntary commitment.  She was discharged to the care of her sister with close follow-up.  We did discuss standard return precautions ? ?MEDICATIONS GIVEN IN ED: ?Medications - No data to display ? ? ? ?FINAL CLINICAL IMPRESSION(S) / ED DIAGNOSES  ? ?Final diagnoses:  ?Altered mental status, unspecified altered mental status type  ? ? ? ?Rx / DC Orders  ? ?ED Discharge Orders   ? ? None  ? ?  ? ? ? ?Note:  This document was prepared using Dragon voice recognition software and may include unintentional dictation errors. ? ? ?Please note:  Patient was evaluated in Emergency Department today for the symptoms described in the history of present illness. Patient was evaluated in the context of the global COVID-19 pandemic, which necessitated consideration that the patient might be at risk for infection with the SARS-CoV-2 virus that causes COVID-19. Institutional protocols and algorithms that pertain to the evaluation of patients at risk for COVID-19 are in a state of rapid change based on information released by regulatory bodies including the CDC and federal and state organizations. These policies and algorithms were followed during the patient's care in the ED.  Some ED  evaluations and interventions may be delayed as a result of limited staffing during the pandemic. ? ? ? ? ?  ?Rudene Re, MD ?01/27/22 0501 ? ?

## 2022-01-27 NOTE — ED Triage Notes (Signed)
Pt brought in via ems from home.  Pt reports she was having a seizure tonight.  Ems reports no seizure activity noted. Pt not postictal.pt alert  speech clear.  No headache.   ?

## 2022-01-27 NOTE — ED Notes (Signed)
Pt discharge information reviewed. Pt understands need for follow up care and when to return if symptoms worsen. All questions answered. Pt is alert and oriented with even and regular respirations. Pt is seen ambulating out of department with string steady gait.   

## 2022-01-27 NOTE — ED Notes (Signed)
ED Provider at bedside. 

## 2022-12-18 ENCOUNTER — Encounter: Payer: Self-pay | Admitting: Emergency Medicine

## 2022-12-18 ENCOUNTER — Other Ambulatory Visit: Payer: Self-pay

## 2022-12-18 ENCOUNTER — Emergency Department
Admission: EM | Admit: 2022-12-18 | Discharge: 2022-12-19 | Disposition: A | Discharge status: Home / Self Care | Payer: No Typology Code available for payment source | Attending: Emergency Medicine | Admitting: Emergency Medicine

## 2022-12-18 DIAGNOSIS — F1210 Cannabis abuse, uncomplicated: Secondary | ICD-10-CM

## 2022-12-18 DIAGNOSIS — F419 Anxiety disorder, unspecified: Secondary | ICD-10-CM | POA: Insufficient documentation

## 2022-12-18 DIAGNOSIS — F1219 Cannabis abuse with unspecified cannabis-induced disorder: Secondary | ICD-10-CM | POA: Insufficient documentation

## 2022-12-18 DIAGNOSIS — E876 Hypokalemia: Secondary | ICD-10-CM | POA: Insufficient documentation

## 2022-12-18 DIAGNOSIS — F19959 Other psychoactive substance use, unspecified with psychoactive substance-induced psychotic disorder, unspecified: Secondary | ICD-10-CM | POA: Insufficient documentation

## 2022-12-18 HISTORY — DX: Anxiety disorder, unspecified: F41.9

## 2022-12-18 LAB — MAGNESIUM: Magnesium: 2.1 mg/dL (ref 1.7–2.4)

## 2022-12-18 LAB — URINE DRUG SCREEN, QUALITATIVE (ARMC ONLY)
Amphetamines, Ur Screen: NOT DETECTED
Barbiturates, Ur Screen: NOT DETECTED
Benzodiazepine, Ur Scrn: NOT DETECTED
Cannabinoid 50 Ng, Ur ~~LOC~~: POSITIVE — AB
Cocaine Metabolite,Ur ~~LOC~~: NOT DETECTED
MDMA (Ecstasy)Ur Screen: NOT DETECTED
Methadone Scn, Ur: NOT DETECTED
Opiate, Ur Screen: NOT DETECTED
Phencyclidine (PCP) Ur S: NOT DETECTED
Tricyclic, Ur Screen: NOT DETECTED

## 2022-12-18 LAB — CBC
HCT: 33.6 % — ABNORMAL LOW (ref 36.0–46.0)
Hemoglobin: 10.6 g/dL — ABNORMAL LOW (ref 12.0–15.0)
MCH: 27.8 pg (ref 26.0–34.0)
MCHC: 31.5 g/dL (ref 30.0–36.0)
MCV: 88.2 fL (ref 80.0–100.0)
Platelets: 250 10*3/uL (ref 150–400)
RBC: 3.81 MIL/uL — ABNORMAL LOW (ref 3.87–5.11)
RDW: 13 % (ref 11.5–15.5)
WBC: 5.8 10*3/uL (ref 4.0–10.5)
nRBC: 0 % (ref 0.0–0.2)

## 2022-12-18 LAB — COMPREHENSIVE METABOLIC PANEL
ALT: 11 U/L (ref 0–44)
AST: 24 U/L (ref 15–41)
Albumin: 4.3 g/dL (ref 3.5–5.0)
Alkaline Phosphatase: 77 U/L (ref 38–126)
Anion gap: 8 (ref 5–15)
BUN: 13 mg/dL (ref 6–20)
CO2: 20 mmol/L — ABNORMAL LOW (ref 22–32)
Calcium: 8.5 mg/dL — ABNORMAL LOW (ref 8.9–10.3)
Chloride: 101 mmol/L (ref 98–111)
Creatinine, Ser: 0.95 mg/dL (ref 0.44–1.00)
GFR, Estimated: >60 mL/min (ref >60)
Glucose, Bld: 205 mg/dL — ABNORMAL HIGH (ref 70–99)
Potassium: 2.6 mmol/L — CL (ref 3.5–5.1)
Sodium: 129 mmol/L — ABNORMAL LOW (ref 135–145)
Total Bilirubin: 1.5 mg/dL — ABNORMAL HIGH (ref 0.3–1.2)
Total Protein: 8.1 g/dL (ref 6.5–8.1)

## 2022-12-18 LAB — POC URINE PREG, ED: Preg Test, Ur: NEGATIVE

## 2022-12-18 LAB — ETHANOL: Alcohol, Ethyl (B): <10 mg/dL (ref <10)

## 2022-12-18 MED ORDER — POTASSIUM CHLORIDE 10 MEQ/100ML IV SOLN
10.0000 meq | INTRAVENOUS | Status: AC
  Administered 2022-12-18 (×3): 10 meq via INTRAVENOUS
  Filled 2022-12-18 (×3): qty 100

## 2022-12-18 MED ORDER — POTASSIUM CHLORIDE CRYS ER 20 MEQ PO TBCR: 20.0000 meq | EXTENDED_RELEASE_TABLET | Freq: Two times a day (BID) | ORAL | 0 refills | Status: DC

## 2022-12-18 MED ORDER — SODIUM CHLORIDE 0.9 % IV BOLUS
1000.0000 mL | Freq: Once | INTRAVENOUS | Status: AC
  Administered 2022-12-18: 1000 mL via INTRAVENOUS

## 2022-12-18 MED ORDER — MAGNESIUM SULFATE 2 GM/50ML IV SOLN
2.0000 g | Freq: Once | INTRAVENOUS | Status: AC
  Administered 2022-12-18: 2 g via INTRAVENOUS
  Filled 2022-12-18: qty 50

## 2022-12-18 MED ORDER — POTASSIUM CHLORIDE CRYS ER 20 MEQ PO TBCR
40.0000 meq | EXTENDED_RELEASE_TABLET | Freq: Once | ORAL | Status: AC
  Administered 2022-12-18: 40 meq via ORAL
  Filled 2022-12-18: qty 2

## 2022-12-18 MED ORDER — ONDANSETRON 4 MG PO TBDP
4.0000 mg | ORAL_TABLET | Freq: Once | ORAL | Status: AC
  Administered 2022-12-18: 4 mg via ORAL
  Filled 2022-12-18: qty 1

## 2022-12-18 MED ORDER — LORAZEPAM 2 MG/ML IJ SOLN
1.0000 mg | Freq: Once | INTRAMUSCULAR | Status: AC
  Administered 2022-12-18: 1 mg via INTRAVENOUS
  Filled 2022-12-18: qty 1

## 2022-12-18 NOTE — Discharge Instructions (Signed)
Your potassium was low today - this is likely diet related. Try to eat a balanced diet including plenty of fruits, like bananas.   Take the potassium supplements.  Set an appt for your psychiatrist to re-start your anxiety medication.

## 2022-12-18 NOTE — Consult Note (Incomplete)
[facility] Consult   Reason for Consult:Ingestion and Anxiety  Referring Physician: Dr. Ellender Hose Patient Identification: Robin Ross MRN:  [id number] Principal Diagnosis: <principal problem not specified> Diagnosis:  Active Problems:   Psychoactive substance-induced psychosis ([place])   Total Time spent with patient: 1 hour  Subjective: "I took an edible and it made me very anxious."    Robin Ross is a 27 y.o. female patient presented to [facility] ED via POV voluntary. The patient came in voicing that she needs help. The patient discussed that she took an edible. 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 The patient sister states several months ago the patient was seen and sent to [facility] after being dx with an "epilepsy thing". The patient's sister reports that the patient was dx with [facility], has not been compliant with her medication that she was discharged from [facility] with.      The patient was seen face-to-face by this provider; chart reviewed and consulted with Dr. ED providers and Dr. Lajuana Ripple who ever doc on call on 03/00/2024 due to the care of the patient. It was discussed with the EDP that the patient does/does not meet the criteria to be admitted to the psychiatric inpatient unit.  On evaluation the patient is alert and oriented x4, calm and cooperative, and mood-congruent with affect.  The patient does not appear to be responding to internal or external stimuli. Neither is the patient presenting with any delusional thinking. The patient denies auditory or visual hallucinations. The patient denies any suicidal, homicidal, or self-harm ideations. The patient is not presenting with any psychotic or paranoid behaviors. During an encounter with the patient, he/she could answer questions appropriately.   HPI:   Past Psychiatric History:  Anxiety  Risk to Self:   Risk to Others:   Prior Inpatient Therapy:   Prior Outpatient Therapy:    Past Medical  History:  Past Medical History:  Diagnosis Date  . Anxiety    History reviewed. No pertinent surgical history. Family History: History reviewed. No pertinent family history. Family Psychiatric  History: History reviewed. No pertinent family psychiatric history. Social History:  Social History   Substance and Sexual Activity  Alcohol Use Yes   Comment: occasionally     Social History   Substance and Sexual Activity  Drug Use Yes  . Types: Marijuana    Social History   Socioeconomic History  . Marital status: Single    Spouse name: Not on file  . Number of children: Not on file  . Years of education: Not on file  . Highest education level: Not on file  Occupational History  . Not on file  Tobacco Use  . Smoking status: Never  . Smokeless tobacco: Not on file  Substance and Sexual Activity  . Alcohol use: Yes    Comment: occasionally  . Drug use: Yes    Types: Marijuana  . Sexual activity: Not on file  Other Topics Concern  . Not on file  Social History Narrative  . Not on file   Social Determinants of Health   Financial Resource Strain: Not on file  Food Insecurity: Not on file  Transportation Needs: Not on file  Physical Activity: Not on file  Stress: Not on file  Social Connections: Not on file   Additional Social History:    Allergies:  No Known Allergies  Labs:  Results for orders placed or performed during the hospital encounter of 12/18/22 (from the past 48 hour(s))  Ethanol  Status: None   Collection Time: 12/18/22  7:14 PM  Result Value Ref Range   Alcohol, Ethyl (B) <10 <10 mg/dL    Comment: (NOTE) Lowest detectable limit for serum alcohol is 10 mg/dL.  For medical purposes only. Performed at [facility], [address] [place], [place] [address]   Comprehensive metabolic panel     Status: Abnormal   Collection Time: 12/18/22  7:15 PM  Result Value Ref Range   Sodium 129 (L) 135 - 145 mmol/L   Potassium 2.6 (LL) 3.5 -  5.1 mmol/L    Comment: CRITICAL RESULT CALLED TO, READ BACK BY AND VERIFIED WITH LISA GEISLER AT 2016 12/18/2022 DLB    Chloride 101 98 - 111 mmol/L   CO2 20 (L) 22 - 32 mmol/L   Glucose, Bld 205 (H) 70 - 99 mg/dL    Comment: Glucose reference range applies only to samples taken after fasting for at least 8 hours.   BUN 13 6 - 20 mg/dL   Creatinine, Ser 0.95 0.44 - 1.00 mg/dL   Calcium 8.5 (L) 8.9 - 10.3 mg/dL   Total Protein 8.1 6.5 - 8.1 g/dL   Albumin 4.3 3.5 - 5.0 g/dL   AST 24 15 - 41 U/L   ALT 11 0 - 44 U/L   Alkaline Phosphatase 77 38 - 126 U/L   Total Bilirubin 1.5 (H) 0.3 - 1.2 mg/dL   GFR, Estimated >60 >60 mL/min    Comment: (NOTE) Calculated using the CKD-EPI Creatinine Equation (2021)    Anion gap 8 5 - 15    Comment: Performed at [facility], [address] [place], [place] [address]  cbc     Status: Abnormal   Collection Time: 12/18/22  7:15 PM  Result Value Ref Range   WBC 5.8 4.0 - 10.5 K/uL   RBC 3.81 (L) 3.87 - 5.11 MIL/uL   Hemoglobin 10.6 (L) 12.0 - 15.0 g/dL   HCT 33.6 (L) 36.0 - 46.0 %   MCV 88.2 80.0 - 100.0 fL   MCH 27.8 26.0 - 34.0 pg   MCHC 31.5 30.0 - 36.0 g/dL   RDW 13.0 11.5 - 15.5 %   Platelets 250 150 - 400 K/uL   nRBC 0.0 0.0 - 0.2 %    Comment: Performed at [facility], [address] [place], [place] [address]  Magnesium     Status: None   Collection Time: 12/18/22  7:15 PM  Result Value Ref Range   Magnesium 2.1 1.7 - 2.4 mg/dL    Comment: Performed at [facility], [address] [place],  [address]  Urine Drug Screen, Qualitative     Status: Abnormal   Collection Time: 12/18/22 10:06 PM  Result Value Ref Range   Tricyclic, Ur Screen NONE DETECTED NONE DETECTED   Amphetamines, Ur Screen NONE DETECTED NONE DETECTED   MDMA (Ecstasy)Ur Screen NONE DETECTED NONE DETECTED   Cocaine Metabolite,Ur [place] NONE DETECTED NONE DETECTED   Opiate, Ur Screen NONE DETECTED NONE DETECTED   Phencyclidine (PCP)  Ur S NONE DETECTED NONE DETECTED   Cannabinoid 50 Ng, Ur  POSITIVE (A) NONE DETECTED   Barbiturates, Ur Screen NONE DETECTED NONE DETECTED   Benzodiazepine, Ur Scrn NONE DETECTED NONE DETECTED   Methadone Scn, Ur NONE DETECTED NONE DETECTED    Comment: (NOTE) Tricyclics + metabolites, urine    Cutoff 1000 ng/mL Amphetamines + metabolites, urine  Cutoff 1000 ng/mL MDMA (Ecstasy), urine  Cutoff 500 ng/mL Cocaine Metabolite, urine          Cutoff 300 ng/mL Opiate + metabolites, urine        Cutoff 300 ng/mL Phencyclidine (PCP), urine         Cutoff 25 ng/mL Cannabinoid, urine                 Cutoff 50 ng/mL Barbiturates + metabolites, urine  Cutoff 200 ng/mL Benzodiazepine, urine              Cutoff 200 ng/mL Methadone, urine                   Cutoff 300 ng/mL  The urine drug screen provides only a preliminary, unconfirmed analytical test result and should not be used for non-medical purposes. Clinical consideration and professional judgment should be applied to any positive drug screen result due to possible interfering substances. A more specific alternate chemical method must be used in order to obtain a confirmed analytical result. Gas chromatography / mass spectrometry (GC/MS) is the preferred confirm atory method. Performed at [facility], [address] [place],  [address]   POC urine preg, ED     Status: None   Collection Time: 12/18/22 10:13 PM  Result Value Ref Range   Preg Test, Ur Negative Negative    Current Facility-Administered Medications  Medication Dose Route Frequency Provider Last Rate Last Admin  . potassium chloride 10 mEq in 100 mL IVPB  10 mEq Intravenous Q1 Hr x 3 Duffy Bruce, MD 100 mL/hr at 12/18/22 2322 10 mEq at 12/18/22 2322   Current Outpatient Medications  Medication Sig Dispense Refill  . potassium chloride SA (KLOR-CON M) 20 MEQ tablet Take 1 tablet (20 mEq total) by mouth 2 (two) times daily for 5 days. 10  tablet 0  . albuterol (PROVENTIL HFA;VENTOLIN HFA) 108 (90 Base) MCG/ACT inhaler Inhale 2 puffs into the lungs every 4 (four) hours as needed for wheezing or shortness of breath. (Patient not taking: Reported on 02/24/2016) 1 Inhaler 0  . FLUoxetine (PROZAC) 20 MG capsule Take 20 mg by mouth daily. (Patient not taking: Reported on 12/18/2022)    . Levonorgestrel-Ethinyl Estradiol (AMETHIA,CAMRESE) 0.15-0.03 &0.01 MG tablet Take 1 tablet by mouth daily. (Patient not taking: Reported on 12/18/2022) 1 Package 4  . OLANZapine zydis (ZYPREXA) 5 MG disintegrating tablet Take 1 tablet by mouth at bedtime. (Patient not taking: Reported on 12/18/2022)      Musculoskeletal: Strength & Muscle Tone: within normal limits Gait & Station: normal Patient leans: N/A Psychiatric Specialty Exam:  Presentation  General Appearance:  Appropriate for Environment  Eye Contact: Good  Speech: Clear and Coherent  Speech Volume: Normal  Handedness: Right   Mood and Affect  Mood: Anxious  Affect: Congruent   Thought Process  Thought Processes: Disorganized  Descriptions of Associations:Loose  Orientation:Full (Time, Place and Person)  Thought Content:Logical  History of Schizophrenia/Schizoaffective disorder:No  Duration of Psychotic Symptoms:No data recorded Hallucinations:Hallucinations: None  Ideas of Reference:None  Suicidal Thoughts:Suicidal Thoughts: No  Homicidal Thoughts:Homicidal Thoughts: No   Sensorium  Memory: Immediate Good; Recent Good; Remote Good  Judgment: Good  Insight: Good   Executive Functions  Concentration: Fair  Attention Span: Fair  Recall: Good  Fund of Knowledge: Good  Language: Good   Psychomotor Activity  Psychomotor Activity: Psychomotor Activity: Normal   Assets  Assets: Communication Skills; Desire for Improvement; Resilience; Social Support   Sleep  Sleep: Sleep: Good Number of Hours of  Sleep: 8   Physical  Exam: Physical Exam Vitals and nursing note reviewed.  Constitutional:      Appearance: Normal appearance. She is normal weight.  HENT:     Head: Normocephalic and atraumatic.     Right Ear: External ear normal.     Left Ear: External ear normal.     Nose: Nose normal.  Cardiovascular:     Rate and Rhythm: Normal rate.     Pulses: Normal pulses.  Musculoskeletal:        General: Normal range of motion.     Cervical back: Normal range of motion and neck supple.  Neurological:     General: No focal deficit present.     Mental Status: She is alert and oriented to person, place, and time. Mental status is at baseline.  Psychiatric:        Attention and Perception: Attention and perception normal.        Mood and Affect: Affect normal. Mood is anxious.        Speech: Speech normal.        Behavior: Behavior normal. Behavior is cooperative.        Thought Content: Thought content normal.        Cognition and Memory: Cognition and memory normal.        Judgment: Judgment normal.    Review of Systems  Psychiatric/Behavioral:  Positive for substance abuse. The patient is nervous/anxious.    Blood pressure 121/72, pulse 88, temperature 98.6 F (37 C), temperature source Oral, resp. rate (!) 21, height '5\' 4"'$  (1.626 m), weight 65.8 kg, SpO2 100 %. Body mass index is 24.89 kg/m.  Treatment Plan Summary: Plan   - Patient does not meet the criteria for psychiatric inpatient admission  Disposition: No evidence of imminent risk to self or others at present.   Patient does not meet criteria for psychiatric inpatient admission. Supportive therapy provided about ongoing stressors. Discussed crisis plan, support from social network, calling 911, coming to the Emergency Department, and calling Suicide Hotline.  Caroline Sauger, NP 12/18/2022 11:39 PM

## 2022-12-18 NOTE — ED Provider Notes (Signed)
[facility] Provider Note    Event Date/Time   First MD Initiated Contact with Patient 12/18/22 1920     (approximate)   History   Ingestion and Anxiety   HPI  Robin Ross is a 27 y.o. female  here with anxiety, THC ingestion. Pt reports that she has been under significant increased stress recently. She has ah /o PNES. She broke up with her boyfriend yesterday. Today, she took a THC edible and became very anxious and had what led up to her "spells" last time so family brought her infor evaluation. She says she is now "coming down" and feels better. No other complaints. She has had slightly poor appetite due to stressors.       Physical Exam   Triage Vital Signs: ED Triage Vitals  Enc Vitals Group     BP 12/18/22 1910 (!) 148/87     Pulse Rate 12/18/22 1910 (!) 135     Resp 12/18/22 1910 (!) 22     Temp 12/18/22 1910 98.6 F (37 C)     Temp Source 12/18/22 1910 Oral     SpO2 12/18/22 1910 100 %     Weight 12/18/22 1911 145 lb (65.8 kg)     Height 12/18/22 1911 '5\' 4"'$  (1.626 m)     Head Circumference --      Peak Flow --      Pain Score 12/18/22 1911 0     Pain Loc --      Pain Edu? --      Excl. in [place]? --     Most recent vital signs: Vitals:   12/19/22 0023 12/19/22 0023  BP: (!) 96/58   Pulse: 78   Resp: 16   Temp:  98.7 F (37.1 C)  SpO2: 99%      General: Awake, no distress.  CV:  Good peripheral perfusion.  Resp:  Normal work of breathing.  Abd:  No distention.  Other:  Anxious, denies SI, HI, AVH.   ED Results / Procedures / Treatments   Labs (all labs ordered are listed, but only abnormal results are displayed) Labs Reviewed  COMPREHENSIVE METABOLIC PANEL - Abnormal; Notable for the following components:      Result Value   Sodium 129 (*)    Potassium 2.6 (*)    CO2 20 (*)    Glucose, Bld 205 (*)    Calcium 8.5 (*)    Total Bilirubin 1.5 (*)    All other components within normal limits  CBC - Abnormal;  Notable for the following components:   RBC 3.81 (*)    Hemoglobin 10.6 (*)    HCT 33.6 (*)    All other components within normal limits  URINE DRUG SCREEN, QUALITATIVE ([facility] ONLY) - Abnormal; Notable for the following components:   Cannabinoid 50 Ng, Ur [place] POSITIVE (*)    All other components within normal limits  ETHANOL  MAGNESIUM  POC URINE PREG, ED     EKG Sinus tachycardia, VR 126. PR 158, QRS 78, QTc 451. No acute st elevations or depressions. No ischemia or infarct.   RADIOLOGY    I also independently reviewed and agree with radiologist interpretations.   PROCEDURES:  Critical Care performed: No    MEDICATIONS ORDERED IN ED: Medications  potassium chloride SA (KLOR-CON M) CR tablet 40 mEq (40 mEq Oral Given 12/18/22 2039)  potassium chloride 10 mEq in 100 mL IVPB (0 mEq Intravenous Stopped 12/19/22 0023)  magnesium sulfate IVPB  2 g 50 mL (0 g Intravenous Stopped 12/18/22 2152)  LORazepam (ATIVAN) injection 1 mg (1 mg Intravenous Given 12/18/22 2030)  sodium chloride 0.9 % bolus 1,000 mL (0 mLs Intravenous Stopped 12/18/22 2239)  ondansetron (ZOFRAN-ODT) disintegrating tablet 4 mg (4 mg Oral Given 12/18/22 2318)  potassium chloride SA (KLOR-CON M) CR tablet 40 mEq (40 mEq Oral Given 12/18/22 2318)     IMPRESSION / MDM / ASSESSMENT AND PLAN / ED COURSE  I reviewed the triage vital signs and the nursing notes.                              Differential diagnosis includes, but is not limited to, PNES, anxiety, stress reaction, THC reaction, panic attack  Patient's presentation is most consistent with acute presentation with potential threat to life or bodily function.  The patient is on the cardiac monitor to evaluate for evidence of arrhythmia and/or significant heart rate changes  Well appearing female here with anxiety in setting of recent stressors and h/o PNES. No actual seizure like activity today. Labs as above, incidentally noted hypokalemia whic I suspect is from  poor PO intake. IV and PO k replaced with mag. EKG intervals normal. Can replete as outpt. Psych evaluated and recommends outpt follow-up with no indication for urgent admission. Pt in agreement with plan.   FINAL CLINICAL IMPRESSION(S) / ED DIAGNOSES   Final diagnoses:  Anxiety  Tetrahydrocannabinol (THC) use disorder, mild, abuse  Hypokalemia     Rx / DC Orders   ED Discharge Orders          Ordered    potassium chloride SA (KLOR-CON M) 20 MEQ tablet  2 times daily        12/19/22 0022    potassium chloride SA (KLOR-CON M) 20 MEQ tablet  2 times daily,   Status:  Discontinued        12/18/22 2321             Note:  This document was prepared using Dragon voice recognition software and may include unintentional dictation errors.   Duffy Bruce, MD 12/19/22 [phone]

## 2022-12-18 NOTE — ED Triage Notes (Signed)
Pt to ED via POV. 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 Pt's sister states several months ago pt was seen and sent to [facility] after being dx with an "epilepsy thing". Pt's sister reports that pt was dx with [facility], has not been compliant with her medication that she was discharged from [facility] with. Pt appears anxious in triage.    Pt states took edible at approx 1600, sister states she got it from the tobacco store.

## 2022-12-18 NOTE — ED Notes (Signed)
TTS team at bedside

## 2022-12-18 NOTE — Consult Note (Signed)
[place] [facility] Consult   Reason for Consult:Ingestion and Anxiety  Referring Physician: Dr. Ellender Hose Patient Identification: Shatiya Bedel MRN:  [id number] Principal Diagnosis: <principal problem not specified> Diagnosis:  Active Problems:   Psychoactive substance-induced psychosis ([place])   Total Time spent with patient: 1 hour  Subjective: "I took an edible and it made me very anxious."    Dametria Deckard is a 27 y.o. female patient presented to [facility] via POV voluntary. The patient does share that she takes an anti-anxiety along with smoking marijuana. Based on the patient's note provided, it seems that the patient presented to the emergency department (ED) voluntarily seeking help after ingesting an edible. The patient disclosed being a former Psychologist, counselling and has a history of being diagnosed with an "epilepsy thing" and being non-compliant with medication prescribed from a previous hospitalization at [facility]. The patient was evaluated by the provider and consulted with Dr. Ellender Hose regarding the patient's care.  The evaluation revealed that the patient is alert, oriented, calm, cooperative, and mood-congruent with affect. There were no signs of responsiveness to internal or external stimuli, delusional thinking, auditory or visual hallucinations, suicidal, homicidal, or self-harm ideations, psychotic or paranoid behaviors. The patient was able to answer questions appropriately during the encounter.  After consultation, it was determined that the patient does not meet the criteria for admission to the psychiatric inpatient unit. Therefore, further management and treatment options were likely discussed with the patient, considering their current condition and history of non-compliance with medication.   HPI:   Past Psychiatric History:  Anxiety  Risk to Self:   Risk to Others:   Prior Inpatient Therapy:   Prior Outpatient Therapy:    Past Medical History:  Past  Medical History:  Diagnosis Date   Anxiety    History reviewed. No pertinent surgical history. Family History: History reviewed. No pertinent family history. Family Psychiatric  History: History reviewed. No pertinent family psychiatric history. Social History:  Social History   Substance and Sexual Activity  Alcohol Use Yes   Comment: occasionally     Social History   Substance and Sexual Activity  Drug Use Yes   Types: Marijuana    Social History   Socioeconomic History   Marital status: Single    Spouse name: Not on file   Number of children: Not on file   Years of education: Not on file   Highest education level: Not on file  Occupational History   Not on file  Tobacco Use   Smoking status: Never   Smokeless tobacco: Not on file  Substance and Sexual Activity   Alcohol use: Yes    Comment: occasionally   Drug use: Yes    Types: Marijuana   Sexual activity: Not on file  Other Topics Concern   Not on file  Social History Narrative   Not on file   Social Determinants of Health   Financial Resource Strain: Not on file  Food Insecurity: Not on file  Transportation Needs: Not on file  Physical Activity: Not on file  Stress: Not on file  Social Connections: Not on file   Additional Social History:    Allergies:  No Known Allergies  Labs:  Results for orders placed or performed during the hospital encounter of 12/18/22 (from the past 48 hour(s))  Ethanol     Status: None   Collection Time: 12/18/22  7:14 PM  Result Value Ref Range   Alcohol, Ethyl (B) <10 <10 mg/dL    Comment: (NOTE)  Lowest detectable limit for serum alcohol is 10 mg/dL.  For medical purposes only. Performed at [facility], [address] [place], [place] [address]   Comprehensive metabolic panel     Status: Abnormal   Collection Time: 12/18/22  7:15 PM  Result Value Ref Range   Sodium 129 (L) 135 - 145 mmol/L   Potassium 2.6 (LL) 3.5 - 5.1 mmol/L    Comment: CRITICAL  RESULT CALLED TO, READ BACK BY AND VERIFIED WITH LISA GEISLER AT 2016 12/18/2022 DLB    Chloride 101 98 - 111 mmol/L   CO2 20 (L) 22 - 32 mmol/L   Glucose, Bld 205 (H) 70 - 99 mg/dL    Comment: Glucose reference range applies only to samples taken after fasting for at least 8 hours.   BUN 13 6 - 20 mg/dL   Creatinine, Ser 0.95 0.44 - 1.00 mg/dL   Calcium 8.5 (L) 8.9 - 10.3 mg/dL   Total Protein 8.1 6.5 - 8.1 g/dL   Albumin 4.3 3.5 - 5.0 g/dL   AST 24 15 - 41 U/L   ALT 11 0 - 44 U/L   Alkaline Phosphatase 77 38 - 126 U/L   Total Bilirubin 1.5 (H) 0.3 - 1.2 mg/dL   GFR, Estimated >60 >60 mL/min    Comment: (NOTE) Calculated using the CKD-EPI Creatinine Equation (2021)    Anion gap 8 5 - 15    Comment: Performed at [facility], [address] [place], [place] [address]  cbc     Status: Abnormal   Collection Time: 12/18/22  7:15 PM  Result Value Ref Range   WBC 5.8 4.0 - 10.5 K/uL   RBC 3.81 (L) 3.87 - 5.11 MIL/uL   Hemoglobin 10.6 (L) 12.0 - 15.0 g/dL   HCT 33.6 (L) 36.0 - 46.0 %   MCV 88.2 80.0 - 100.0 fL   MCH 27.8 26.0 - 34.0 pg   MCHC 31.5 30.0 - 36.0 g/dL   RDW 13.0 11.5 - 15.5 %   Platelets 250 150 - 400 K/uL   nRBC 0.0 0.0 - 0.2 %    Comment: Performed at [facility], [address] [place], [place] [address]  Magnesium     Status: None   Collection Time: 12/18/22  7:15 PM  Result Value Ref Range   Magnesium 2.1 1.7 - 2.4 mg/dL    Comment: Performed at [facility], [address] [place], [place] [address]  Urine Drug Screen, Qualitative     Status: Abnormal   Collection Time: 12/18/22 10:06 PM  Result Value Ref Range   Tricyclic, Ur Screen NONE DETECTED NONE DETECTED   Amphetamines, Ur Screen NONE DETECTED NONE DETECTED   MDMA (Ecstasy)Ur Screen NONE DETECTED NONE DETECTED   Cocaine Metabolite,Ur [place] NONE DETECTED NONE DETECTED   Opiate, Ur Screen NONE DETECTED NONE DETECTED   Phencyclidine (PCP) Ur S NONE DETECTED NONE DETECTED    Cannabinoid 50 Ng, Ur [place] POSITIVE (A) NONE DETECTED   Barbiturates, Ur Screen NONE DETECTED NONE DETECTED   Benzodiazepine, Ur Scrn NONE DETECTED NONE DETECTED   Methadone Scn, Ur NONE DETECTED NONE DETECTED    Comment: (NOTE) Tricyclics + metabolites, urine    Cutoff 1000 ng/mL Amphetamines + metabolites, urine  Cutoff 1000 ng/mL MDMA (Ecstasy), urine              Cutoff 500 ng/mL Cocaine Metabolite, urine          Cutoff 300 ng/mL Opiate + metabolites,  urine        Cutoff 300 ng/mL Phencyclidine (PCP), urine         Cutoff 25 ng/mL Cannabinoid, urine                 Cutoff 50 ng/mL Barbiturates + metabolites, urine  Cutoff 200 ng/mL Benzodiazepine, urine              Cutoff 200 ng/mL Methadone, urine                   Cutoff 300 ng/mL  The urine drug screen provides only a preliminary, unconfirmed analytical test result and should not be used for non-medical purposes. Clinical consideration and professional judgment should be applied to any positive drug screen result due to possible interfering substances. A more specific alternate chemical method must be used in order to obtain a confirmed analytical result. Gas chromatography / mass spectrometry (GC/MS) is the preferred confirm atory method. Performed at [facility], [address] [place], [place] [address]   POC urine preg, ED     Status: None   Collection Time: 12/18/22 10:13 PM  Result Value Ref Range   Preg Test, Ur Negative Negative    Current Facility-Administered Medications  Medication Dose Route Frequency Provider Last Rate Last Admin   potassium chloride 10 mEq in 100 mL IVPB  10 mEq Intravenous Q1 Hr x 3 Duffy Bruce, MD 100 mL/hr at 12/18/22 2322 10 mEq at 12/18/22 2322   Current Outpatient Medications  Medication Sig Dispense Refill   potassium chloride SA (KLOR-CON M) 20 MEQ tablet Take 1 tablet (20 mEq total) by mouth 2 (two) times daily for 5 days. 10 tablet 0   albuterol (PROVENTIL  HFA;VENTOLIN HFA) 108 (90 Base) MCG/ACT inhaler Inhale 2 puffs into the lungs every 4 (four) hours as needed for wheezing or shortness of breath. (Patient not taking: Reported on 02/24/2016) 1 Inhaler 0   FLUoxetine (PROZAC) 20 MG capsule Take 20 mg by mouth daily. (Patient not taking: Reported on 12/18/2022)     Levonorgestrel-Ethinyl Estradiol (AMETHIA,CAMRESE) 0.15-0.03 &0.01 MG tablet Take 1 tablet by mouth daily. (Patient not taking: Reported on 12/18/2022) 1 Package 4   OLANZapine zydis (ZYPREXA) 5 MG disintegrating tablet Take 1 tablet by mouth at bedtime. (Patient not taking: Reported on 12/18/2022)      Musculoskeletal: Strength & Muscle Tone: within normal limits Gait & Station: normal Patient leans: N/A Psychiatric Specialty Exam:  Presentation  General Appearance:  Appropriate for Environment  Eye Contact: Good  Speech: Clear and Coherent  Speech Volume: Normal  Handedness: Right   Mood and Affect  Mood: Anxious  Affect: Congruent   Thought Process  Thought Processes: Disorganized  Descriptions of Associations:Loose  Orientation:Full (Time, Place and Person)  Thought Content:Logical  History of Schizophrenia/Schizoaffective disorder:No  Duration of Psychotic Symptoms:No data recorded Hallucinations:Hallucinations: None  Ideas of Reference:None  Suicidal Thoughts:Suicidal Thoughts: No  Homicidal Thoughts:Homicidal Thoughts: No   Sensorium  Memory: Immediate Good; Recent Good; Remote Good  Judgment: Good  Insight: Good   Executive Functions  Concentration: Fair  Attention Span: Fair  Recall: Good  Fund of Knowledge: Good  Language: Good   Psychomotor Activity  Psychomotor Activity: Psychomotor Activity: Normal   Assets  Assets: Communication Skills; Desire for Improvement; Resilience; Social Support   Sleep  Sleep: Sleep: Good Number of Hours of Sleep: 8   Physical Exam: Physical Exam Vitals and nursing note  reviewed.  Constitutional:  Appearance: Normal appearance. She is normal weight.  HENT:     Head: Normocephalic and atraumatic.     Right Ear: External ear normal.     Left Ear: External ear normal.     Nose: Nose normal.  Cardiovascular:     Rate and Rhythm: Normal rate.     Pulses: Normal pulses.  Musculoskeletal:        General: Normal range of motion.     Cervical back: Normal range of motion and neck supple.  Neurological:     General: No focal deficit present.     Mental Status: She is alert and oriented to person, place, and time. Mental status is at baseline.  Psychiatric:        Attention and Perception: Attention and perception normal.        Mood and Affect: Affect normal. Mood is anxious.        Speech: Speech normal.        Behavior: Behavior normal. Behavior is cooperative.        Thought Content: Thought content normal.        Cognition and Memory: Cognition and memory normal.        Judgment: Judgment normal.    Review of Systems  Psychiatric/Behavioral:  Positive for substance abuse. The patient is nervous/anxious.    Blood pressure 121/72, pulse 88, temperature 98.6 F (37 C), temperature source Oral, resp. rate (!) 21, height '5\' 4"'$  (1.626 m), weight 65.8 kg, SpO2 100 %. Body mass index is 24.89 kg/m.  Treatment Plan Summary: Plan   - Patient does not meet the criteria for psychiatric inpatient admission  Disposition: No evidence of imminent risk to self or others at present.   Patient does not meet criteria for psychiatric inpatient admission. Supportive therapy provided about ongoing stressors. Discussed crisis plan, support from social network, calling 911, coming to the Emergency Department, and calling Suicide Hotline.  Caroline Sauger, NP 12/18/2022 11:39 PM

## 2022-12-18 NOTE — ED Notes (Signed)
Ellender Hose, MD, made aware of potassium 2.6

## 2022-12-19 DIAGNOSIS — F419 Anxiety disorder, unspecified: Secondary | ICD-10-CM

## 2022-12-19 MED ORDER — POTASSIUM CHLORIDE CRYS ER 20 MEQ PO TBCR: 20.0000 meq | EXTENDED_RELEASE_TABLET | Freq: Two times a day (BID) | ORAL | 0 refills | Status: DC

## 2022-12-20 ENCOUNTER — Other Ambulatory Visit: Payer: Self-pay

## 2022-12-20 ENCOUNTER — Emergency Department
Admission: EM | Admit: 2022-12-20 | Discharge: 2022-12-21 | Disposition: A | Discharge status: Other Acute Inpatient Hospital | Payer: No Typology Code available for payment source | Attending: Emergency Medicine | Admitting: Emergency Medicine

## 2022-12-20 DIAGNOSIS — Z1152 Encounter for screening for COVID-19: Secondary | ICD-10-CM | POA: Insufficient documentation

## 2022-12-20 DIAGNOSIS — R451 Restlessness and agitation: Secondary | ICD-10-CM

## 2022-12-20 DIAGNOSIS — F29 Unspecified psychosis not due to a substance or known physiological condition: Secondary | ICD-10-CM | POA: Diagnosis not present

## 2022-12-20 DIAGNOSIS — F1210 Cannabis abuse, uncomplicated: Secondary | ICD-10-CM | POA: Diagnosis not present

## 2022-12-20 DIAGNOSIS — F23 Brief psychotic disorder: Secondary | ICD-10-CM | POA: Diagnosis not present

## 2022-12-20 DIAGNOSIS — F419 Anxiety disorder, unspecified: Secondary | ICD-10-CM | POA: Diagnosis not present

## 2022-12-20 LAB — CBC WITH DIFFERENTIAL/PLATELET
Abs Immature Granulocytes: 0.03 10*3/uL (ref 0.00–0.07)
Basophils Absolute: 0 10*3/uL (ref 0.0–0.1)
Basophils Relative: 0 %
Eosinophils Absolute: 0 10*3/uL (ref 0.0–0.5)
Eosinophils Relative: 0 %
HCT: 35.8 % — ABNORMAL LOW (ref 36.0–46.0)
Hemoglobin: 11 g/dL — ABNORMAL LOW (ref 12.0–15.0)
Immature Granulocytes: 1 %
Lymphocytes Relative: 13 %
Lymphs Abs: 0.8 10*3/uL (ref 0.7–4.0)
MCH: 27.5 pg (ref 26.0–34.0)
MCHC: 30.7 g/dL (ref 30.0–36.0)
MCV: 89.5 fL (ref 80.0–100.0)
Monocytes Absolute: 0.8 10*3/uL (ref 0.1–1.0)
Monocytes Relative: 13 %
Neutro Abs: 4.3 10*3/uL (ref 1.7–7.7)
Neutrophils Relative %: 73 %
Platelets: 261 10*3/uL (ref 150–400)
RBC: 4 MIL/uL (ref 3.87–5.11)
RDW: 13.5 % (ref 11.5–15.5)
WBC: 5.9 10*3/uL (ref 4.0–10.5)
nRBC: 0 % (ref 0.0–0.2)

## 2022-12-20 LAB — COMPREHENSIVE METABOLIC PANEL
ALT: 10 U/L (ref 0–44)
AST: 34 U/L (ref 15–41)
Albumin: 4.8 g/dL (ref 3.5–5.0)
Alkaline Phosphatase: 86 U/L (ref 38–126)
Anion gap: 12 (ref 5–15)
BUN: 10 mg/dL (ref 6–20)
CO2: 19 mmol/L — ABNORMAL LOW (ref 22–32)
Calcium: 9.6 mg/dL (ref 8.9–10.3)
Chloride: 105 mmol/L (ref 98–111)
Creatinine, Ser: 1.01 mg/dL — ABNORMAL HIGH (ref 0.44–1.00)
GFR, Estimated: >60 mL/min (ref >60)
Glucose, Bld: 122 mg/dL — ABNORMAL HIGH (ref 70–99)
Potassium: 4 mmol/L (ref 3.5–5.1)
Sodium: 135 mmol/L (ref 135–145)
Total Bilirubin: 1 mg/dL (ref 0.3–1.2)
Total Protein: 9.3 g/dL — ABNORMAL HIGH (ref 6.5–8.1)

## 2022-12-20 LAB — CBG MONITORING, ED: Glucose-Capillary: 141 mg/dL — ABNORMAL HIGH (ref 70–99)

## 2022-12-20 LAB — MAGNESIUM: Magnesium: 2.3 mg/dL (ref 1.7–2.4)

## 2022-12-20 LAB — ETHANOL: Alcohol, Ethyl (B): <10 mg/dL (ref <10)

## 2022-12-20 MED ORDER — ALUM & MAG HYDROXIDE-SIMETH 200-200-20 MG/5ML PO SUSP: 30.0000 mL | Freq: Four times a day (QID) | ORAL | Status: DC | PRN

## 2022-12-20 MED ORDER — LORAZEPAM 2 MG/ML IJ SOLN: 2.0000 mg | Freq: Once | INTRAMUSCULAR | Status: AC

## 2022-12-20 MED ORDER — OLANZAPINE 5 MG PO TBDP
5.0000 mg | ORAL_TABLET | ORAL | Status: AC
  Administered 2022-12-20: 5 mg via ORAL
  Filled 2022-12-20: qty 1

## 2022-12-20 MED ORDER — ONDANSETRON HCL 4 MG PO TABS: 4.0000 mg | ORAL_TABLET | Freq: Three times a day (TID) | ORAL | Status: DC | PRN

## 2022-12-20 MED ORDER — LORAZEPAM 2 MG/ML IJ SOLN
INTRAMUSCULAR | Status: AC
  Administered 2022-12-20: 2 mg via INTRAMUSCULAR
  Filled 2022-12-20: qty 1

## 2022-12-20 MED ORDER — IBUPROFEN 600 MG PO TABS: 600.0000 mg | ORAL_TABLET | Freq: Three times a day (TID) | ORAL | Status: DC | PRN

## 2022-12-20 MED ORDER — OLANZAPINE 5 MG PO TABS
5.0000 mg | ORAL_TABLET | Freq: Every day | ORAL | Status: DC
  Administered 2022-12-21: 5 mg via ORAL
  Filled 2022-12-20: qty 1

## 2022-12-20 NOTE — ED Notes (Signed)
MD wong requests blood sugar, and continuation of monitoring.

## 2022-12-20 NOTE — ED Notes (Signed)
Patient moved to [facility] without incident due to patient trying to leave room in behavioral quad and taking clothes off, urinating in floor. Patient oriented to unit.

## 2022-12-20 NOTE — ED Notes (Signed)
Pt resting in bed, even chest rise and fall noted

## 2022-12-20 NOTE — ED Notes (Signed)
No lab orders per Dr Joni Fears, recent labs done 2 days ago.

## 2022-12-20 NOTE — ED Notes (Signed)
Pt requesting to use phone to call her sister.

## 2022-12-20 NOTE — ED Notes (Signed)
Pt to be moved to the [facility]

## 2022-12-20 NOTE — ED Notes (Signed)
Sister getting numbers out of the pt's phone to contact the pt's employer. Pt wanting to be in the loop on pt's care, wanting to ensure pt will not be transferred to another facility without her knowledge. Sister also states that pt has been off of her Zyprexa medication for a while. She states that she was also diagnosed with an unspecified psychotic disorder.

## 2022-12-20 NOTE — ED Provider Notes (Signed)
This provider called to [facility] due to reports of patient having seizure-like activity after being found by nursing staff with shaking for less than 1 minute and slow return to her baseline.  Upon my arrival, patient is only responding to sternal rub.  Patient was given 2 mg IM Ativan.  Care of this patient was transferred to Dr. Jacelyn Grip.  All further care and disposition decisions will be made by this provider   Naaman Plummer, MD 12/20/22 1946

## 2022-12-20 NOTE — ED Notes (Signed)
Pt attempting to walk out of room. Pt redirected back to room by security Lennox Grumbles, and EDT Abigail.

## 2022-12-20 NOTE — ED Notes (Addendum)
Pt pulled newly placed IV out, and took off monitor. Pt ao x 4 with intermittent confusion per sister. Pt slow to respond but ao x 4.

## 2022-12-20 NOTE — ED Notes (Signed)
Changed pt to burgundy scrubs, pt hoodie, tshirt, pants in pt belonging bag. Pt's sister took pt's 1 necklace, 1 bracelet and pair of earrings.

## 2022-12-20 NOTE — Consult Note (Signed)
[facility] Face-to-Face Psychiatry Consult   Reason for Consult:  psychosis Referring Physician:  Stafford/Goodman Patient Identification: Robin Ross MRN:  [id number] Principal Diagnosis: Psychosis ([place]) Diagnosis:  Principal Problem:   Psychosis ([place])   Total Time spent with patient: 45 minutes  Subjective:   Robin Ross is a 27 y.o. female patient admitted with psychosis.  HPI:  Patient presents to the ED via EMS, after sister called for what she believes was a seizure.    On evaluation, patient is alert. She is oriented to self only. She is unable formulate coherent answers to questions. She has paucity of speech. She is laying in bed, then sits up, smiles at me inappropriately, just stares without answering. When she replies, her answers are irrelevant to topic:  "My dad's a narcissist; gotcha, gotcha; you are correct; I'm a veteran."      Collateral from sister at bedside: Sister reports that patient  was diagnosed with "non-epileptic spells" when she had her follow-up appointments after being discharged from [facility] in March 2023. She says patient did take Zyprexa for "awhile" but seemed to get better and has not been taking it recently--not sure when she stopped. Patient reports that patient had a break-up with her boyfriend 3 days ago. Sister states that she went to visit patient on 3/8 and ended up staying with patient because she was so disorganized.  Patient's presentation was similar to this a year ago when she also  had a break-up with a boyfriend.  Sister reports that patient "woke up from a seizure" today and that is when she called 911.  Sister describes clonic movements, thinks she urinated, and was "foaming at the mouth." Patient admits to taking "gummies", but then says she didn't. 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 Family History: No family history on file. Family Psychiatric  History:  Social History:  Social History   Substance and Sexual Activity  Alcohol Use Yes   Comment: occasionally     Social History   Substance and Sexual Activity  Drug Use Yes   Types: Marijuana    Social History   Socioeconomic History   Marital status: Single    Spouse name: Not on file   Number of children: Not on file   Years of education: Not on file   Highest education level: Not on file  Occupational History   Not on file  Tobacco Use   Smoking status: Never   Smokeless tobacco: Not on file  Substance and Sexual Activity   Alcohol use: Yes    Comment: occasionally   Drug use: Yes    Types: Marijuana   Sexual activity: Not on file  Other Topics Concern   Not on file  Social History Narrative   Not on file   Social Determinants of Health   Financial Resource Strain: Not on file  Food Insecurity: Not on file  Transportation Needs: Not on file  Physical Activity: Not on file  Stress: Not on file  Social Connections: Not  on file   Additional Social History:    Allergies:  No Known Allergies  Labs:  Results for orders placed or performed during the hospital encounter of 12/20/22 (from the past 48 hour(s))  CBC with Diff     Status: Abnormal   Collection Time: 12/20/22  1:40 PM  Result Value Ref Range   WBC 5.9 4.0 - 10.5 K/uL   RBC 4.00 3.87 - 5.11 MIL/uL   Hemoglobin 11.0 (L) 12.0 - 15.0 g/dL   HCT 35.8 (L) 36.0 - 46.0 %   MCV 89.5 80.0 - 100.0 fL   MCH 27.5 26.0 - 34.0 pg   MCHC 30.7 30.0 - 36.0 g/dL   RDW 13.5 11.5 - 15.5 %   Platelets 261 150 - 400 K/uL   nRBC 0.0 0.0 - 0.2 %   Neutrophils  Relative % 73 %   Neutro Abs 4.3 1.7 - 7.7 K/uL   Lymphocytes Relative 13 %   Lymphs Abs 0.8 0.7 - 4.0 K/uL   Monocytes Relative 13 %   Monocytes Absolute 0.8 0.1 - 1.0 K/uL   Eosinophils Relative 0 %   Eosinophils Absolute 0.0 0.0 - 0.5 K/uL   Basophils Relative 0 %   Basophils Absolute 0.0 0.0 - 0.1 K/uL   Immature Granulocytes 1 %   Abs Immature Granulocytes 0.03 0.00 - 0.07 K/uL    Comment: Performed at [facility], [address] [place], [place] [address]    Current Facility-Administered Medications  Medication Dose Route Frequency Provider Last Rate Last Admin   alum & mag hydroxide-simeth (MAALOX/MYLANTA) 200-200-20 MG/5ML suspension 30 mL  30 mL Oral Q6H PRN Carrie Mew, MD       ibuprofen (ADVIL) tablet 600 mg  600 mg Oral Q8H PRN Carrie Mew, MD       OLANZapine zydis (ZYPREXA) disintegrating tablet 5 mg  5 mg Oral NOW Sherlon Handing, NP       ondansetron [facility]) tablet 4 mg  4 mg Oral Q8H PRN Carrie Mew, MD       Current Outpatient Medications  Medication Sig Dispense Refill   albuterol (PROVENTIL HFA;VENTOLIN HFA) 108 (90 Base) MCG/ACT inhaler Inhale 2 puffs into the lungs every 4 (four) hours as needed for wheezing or shortness of breath. (Patient not taking: Reported on 02/24/2016) 1 Inhaler 0   FLUoxetine (PROZAC) 20 MG capsule Take 20 mg by mouth daily. (Patient not taking: Reported on 12/18/2022)     Levonorgestrel-Ethinyl Estradiol (AMETHIA,CAMRESE) 0.15-0.03 &0.01 MG tablet Take 1 tablet by mouth daily. (Patient not taking: Reported on 12/18/2022) 1 Package 4   OLANZapine zydis (ZYPREXA) 5 MG disintegrating tablet Take 1 tablet by mouth at bedtime. (Patient not taking: Reported on 12/18/2022)     potassium chloride SA (KLOR-CON M) 20 MEQ tablet Take 1 tablet (20 mEq total) by mouth 2 (two) times daily for 5 days. 10 tablet 0    Musculoskeletal: Strength & Muscle Tone: within normal limits Gait & Station: normal Patient leans:  N/A            Psychiatric Specialty Exam:  Presentation  General Appearance:  Bizarre  Eye Contact: Good  Speech: Blocked  Speech Volume: Normal  Handedness: Right   Mood and Affect  Mood: Dysphoric (UTA; inappropriate)  Affect: Inappropriate   Thought Process  Thought Processes: Disorganized  Descriptions of Associations:Tangential  Orientation:Partial  Thought Content:Illogical  History of Schizophrenia/Schizoaffective disorder:No  Duration of Psychotic Symptoms:No data recorded Hallucinations:Hallucinations: -- (UTA)  Ideas of Reference:-- (  UTA)  Suicidal Thoughts:Suicidal Thoughts: -- (UTA)  Homicidal Thoughts:Homicidal Thoughts: -- (UTA)   Sensorium  Memory: Immediate Poor; Recent Poor  Judgment: Impaired  Insight: Lacking   Executive Functions  Concentration: Poor  Attention Span: Poor  Recall: Poor  Fund of Knowledge: Poor  Language: Poor   Psychomotor Activity  Psychomotor Activity:Psychomotor Activity: Normal   Assets  Assets: Vocational/Educational; Physical Health; Social Support; Catering manager; Housing; Desire for Improvement   Sleep  Sleep:Sleep: Fair   Physical Exam: Physical Exam HENT:     Head: Normocephalic.     Nose: No congestion or rhinorrhea.  Eyes:     General:        Right eye: No discharge.        Left eye: No discharge.  Pulmonary:     Effort: Pulmonary effort is normal.  Musculoskeletal:        General: Normal range of motion.  Neurological:     Mental Status: She is alert.  Psychiatric:        Attention and Perception: She is inattentive.        Mood and Affect: Affect is inappropriate.        Speech: She is noncommunicative. Speech is tangential.        Behavior: Behavior is withdrawn.    Review of Systems  Constitutional: Negative.   Respiratory: Negative.    Psychiatric/Behavioral:  Positive for substance abuse.        Psychosis   Blood  pressure 130/74, pulse (!) 109, temperature 98 F (36.7 C), temperature source Oral, resp. rate 18, height '5\' 4"'$  (1.626 m), weight 65.8 kg, last menstrual period 12/17/2022, SpO2 97 %. Body mass index is 24.89 kg/m.  Treatment Plan Summary: Daily contact with patient to assess and evaluate symptoms and progress in treatment, Medication management, and Plan Re-start Zyprexa 5 mg now.  Reviewed with EDP.   Disposition: Recommend psychiatric Inpatient admission when medically cleared. Supportive therapy provided about ongoing stressors.  Sherlon Handing, NP 12/20/2022 3:54 PM

## 2022-12-20 NOTE — ED Notes (Signed)
Pt's sister went to room 15 looking for pt. Sister then came to the quad attempting to go to the pt's room. Security Lennox Grumbles informed her that she will need to be turn in her belongings. Patient relations Peggye Form called to come back to room 23 for the duration of the visit.

## 2022-12-20 NOTE — Consult Note (Signed)
Pt attempted to elope. She continues to have altered mental status/psychosis. Writer IVC'd patient.   Sherlon Handing, PMHNP

## 2022-12-20 NOTE — ED Provider Notes (Signed)
[facility] Provider Note    Event Date/Time   First MD Initiated Contact with Patient 12/20/22 1333     (approximate)   History   Chief Complaint: Seizures   HPI  Robin Ross is a 27 y.o. female who was brought to the ED due to shaking spells earlier today.  History provided by sister who notes that patient has been more agitated and acting bizarrely for the last few days, not taking her olanzapine.  Patient does not engage in interview, does not answer questions.     Physical Exam   Triage Vital Signs: ED Triage Vitals  Enc Vitals Group     BP 12/20/22 1337 130/74     Pulse Rate 12/20/22 1337 (!) 109     Resp 12/20/22 1337 18     Temp 12/20/22 1337 98 F (36.7 C)     Temp Source 12/20/22 1337 Oral     SpO2 12/20/22 1337 97 %     Weight 12/20/22 1338 145 lb (65.8 kg)     Height 12/20/22 1338 '5\' 4"'$  (1.626 m)     Head Circumference --      Peak Flow --      Pain Score 12/20/22 1336 0     Pain Loc --      Pain Edu? --      Excl. in [place]? --     Most recent vital signs: Vitals:   12/20/22 1337  BP: 130/74  Pulse: (!) 109  Resp: 18  Temp: 98 F (36.7 C)  SpO2: 97%    General: Awake, no distress.  Tracking eye movements CV:  Good peripheral perfusion.  Resp:  Normal effort.  Abd:  No distention.  Other:  No wounds   ED Results / Procedures / Treatments   Labs (all labs ordered are listed, but only abnormal results are displayed) Labs Reviewed  COMPREHENSIVE METABOLIC PANEL  ETHANOL  URINE DRUG SCREEN, QUALITATIVE ([facility] ONLY)  CBC WITH DIFFERENTIAL/PLATELET  POC URINE PREG, ED     EKG    RADIOLOGY    PROCEDURES:  Procedures   MEDICATIONS ORDERED IN ED: Medications  ibuprofen (ADVIL) tablet 600 mg (has no administration in time range)  ondansetron (ZOFRAN) tablet 4 mg (has no administration in time range)  alum & mag hydroxide-simeth (MAALOX/MYLANTA) 200-200-20 MG/5ML suspension 30 mL (has no  administration in time range)  OLANZapine zydis (ZYPREXA) disintegrating tablet 5 mg (has no administration in time range)     IMPRESSION / MDM / ASSESSMENT AND PLAN / ED COURSE  I reviewed the triage vital signs and the nursing notes.  Patient's presentation is most consistent with exacerbation of chronic illness.  Presents with odd affect, withdrawn.  No evidence of epileptic seizures.  Will request psychiatry reassessment due to change in symptoms from 2 days ago.  Will see any risk for imminent self-harm, not committable.  The patient has been placed in psychiatric observation due to the need to provide a safe environment for the patient while obtaining psychiatric consultation and evaluation, as well as ongoing medical and medication management to treat the patient's condition.  The patient has not been placed under full IVC at this time.   ----------------------------------------- 3:23 PM on 12/20/2022 ----------------------------------------- Psychiatry plans for behavioral medicine admission.     FINAL CLINICAL IMPRESSION(S) / ED DIAGNOSES   Final diagnoses:  Agitation     Rx / DC Orders   ED Discharge Orders     None  Note:  This document was prepared using Dragon voice recognition software and may include unintentional dictation errors.   Carrie Mew, MD 12/20/22 1524

## 2022-12-20 NOTE — ED Notes (Signed)
EDT Abigail notified me that pt was having a seizure. Pt noted to be having an apparent seizure. MD Jacelyn Grip notified, and at bedside.

## 2022-12-20 NOTE — ED Provider Notes (Signed)
Emergency Medicine Observation Re-evaluation Note  Robin Ross is a 27 y.o. female, seen on rounds today.  Pt initially presented to the ED for complaints of Seizures Currently, the patient is restring comfortably  Physical Exam  BP 113/62   Pulse 83   Temp 98 F (36.7 C) (Oral)   Resp (!) 22   Ht '5\' 4"'$  (1.626 m)   Wt 65.8 kg   LMP 12/17/2022 (Exact Date)   SpO2 100%   BMI 24.89 kg/m  Physical Exam General: no distress, sleeping Lungs: nml wob Psych: patient sleeping, did not wake  ED Course / MDM  EKG:   I have reviewed the labs performed to date as well as medications administered while in observation.  Recent changes in the last 24 hours include patient had several episodes of what are presumed to nonepileptic seizures.  Did receive 2 mg of IM Ativan while in the [place].  Has not had any recurrent episodes.  Plan  Current plan is for inpatient psych.  Patient is under IVC.Marland Kitchen    Rada Hay, MD 12/20/22 2329

## 2022-12-20 NOTE — ED Notes (Addendum)
At 1925 patient was seen at door knocking on the camera by this Probation officer. At 1926 patient was on the floor. This writer went into the unit to check on patient and she was laying on the floor drooling and with convulsing movements.  Charge RN Event organiser notified at [phone]. EDP Dr Cheri Fowler to the unit at [place]. IM ativan given at 1931. Patient was moved from [place] [facility] to main ED at 1935.

## 2022-12-20 NOTE — ED Notes (Signed)
Pts additional Belongings:  Public librarian

## 2022-12-20 NOTE — ED Triage Notes (Signed)
Pt bib EMS called by sister for "seizure". Upon EMS arrival, pt was asleep on the couch and had "shakes" per sister but woke up to voice. Pt denies any complaints at this time aside from "I want to go to sleep". Pt on the phone upon arrival to ED, got up from stretcher to bed. Pt was here 2 days ago, took edibles. Per EMS, pt was supposed to be on Psych meds but stopped beginning of the year; not on seizure meds.  132/85 HR 84 CBG 104

## 2022-12-20 NOTE — BH Assessment (Signed)
Comprehensive Clinical Assessment (CCA) Note  12/20/2022 Robin Ross [id number]  Robin Ross, 27 year old female who presents to [facility] ED involuntarily for treatment. Per triage note, Pt bib EMS called by sister for "seizure". Upon EMS arrival, pt was asleep on the couch and had "shakes" per sister but woke up to voice. Pt denies any complaints at this time aside from "I want to go to sleep". Pt on the phone upon arrival to ED, got up from stretcher to bed. Pt was here 2 days ago, took edibles. Per EMS, pt was supposed to be on Psych meds but stopped beginning of the year; not on seizure meds.   During TTS assessment pt presents alert and disoriented x 3. Patient only knows her name. Patient is restless but cooperative, and mood-congruent with affect. The pt appears to be responding to internal stimuli. Pt is presenting with delusional thinking. Pt verified the information provided to triage RN.   Pt is not able to form thoughts to express them. Patient is confused with disorganized speech. Patient has frequent pauses when attempting to respond to questions. Patient answers with "gotcha, I see; I am sorry; my dad is a narcissist." Patient states she recently used drugs but is unable to say what exactly it was.    Pt provided her sister as a collateral contact. Per Barbaraann Share, NP: Collateral from sister at bedside: Sister reports that patient  was diagnosed with "non-epileptic spells" when she had her follow-up appointments after being discharged from [facility] in March 2023. She says patient did take Zyprexa for "awhile" but seemed to get better and has not been taking it recently--not sure when she stopped. Patient reports that patient had a break-up with her boyfriend 3 days ago. Sister states that she went to visit patient on 3/8 and ended up staying with patient because she was so disorganized.  Patient's presentation was similar to this a year ago when she also  had a break-up with  a boyfriend.  Sister reports that patient "woke up from a seizure" today and that is when she called 911.  Sister describes clonic movements, thinks she urinated, and was "foaming at the mouth." Patient admits to taking "gummies", but then says she didn't. No UDS at time of eval; UDS was positive for cannabinoids  on 12/18/22, when she presented to the ED d/t anxiety after "eating an edible."   Per Barbaraann Share, NP, pt is recommended for inpatient psychiatric admission.  Chief Complaint:  Chief Complaint  Patient presents with   Seizures   Visit Diagnosis: Psychosis    CCA Screening, Triage and Referral (STR)  Patient Reported Information How did you hear about [place]? Family/Friend  Referral name: No data recorded Referral phone number: No data recorded  Whom do you see for routine medical problems? No data recorded Practice/Facility Name: No data recorded Practice/Facility Phone Number: No data recorded Name of Contact: No data recorded Contact Number: No data recorded Contact Fax Number: No data recorded Prescriber Name: No data recorded Prescriber Address (if known): No data recorded  What Is the Reason for Your Visit/Call Today? Patient brought to ED due to altered mental status.  How Long Has This Been Causing You Problems? <Week  What Do You Feel Would Help You the Most Today? No data recorded  Have You Recently Been in Any Inpatient Treatment (Hospital/Detox/Crisis Center/28-Day Program)? No data recorded Name/Location of Program/Hospital:No data recorded How Long Were You There? No data recorded When Were You Discharged? No data  recorded  Have You Ever Received Services From [facility] Before? No data recorded Who Do You See at [facility]? No data recorded  Have You Recently Had Any Thoughts About Hurting Yourself? No  Are You Planning to Commit Suicide/Harm Yourself At This time? No   Have you Recently Had Thoughts About [place]? No  Explanation: No data  recorded  Have You Used Any Alcohol or Drugs in the Past 24 Hours? -- (Unknown)  How Long Ago Did You Use Drugs or Alcohol? No data recorded What Did You Use and How Much? No data recorded  Do You Currently Have a Therapist/Psychiatrist? No  Name of Therapist/Psychiatrist: No data recorded  Have You Been Recently Discharged From Any Office Practice or Programs? No  Explanation of Discharge From Practice/Program: No data recorded    CCA Screening Triage Referral Assessment Type of Contact: Face-to-Face  Is this Initial or Reassessment? No data recorded Date Telepsych consult ordered in [facility]:  No data recorded Time Telepsych consult ordered in [facility]:  No data recorded  Patient Reported Information Reviewed? No data recorded Patient Left Without Being Seen? No data recorded Reason for Not Completing Assessment: No data recorded  Collateral Involvement: Patient's sister   Does Patient Have a Court Appointed Legal Guardian? No data recorded Name and Contact of Legal Guardian: No data recorded If Minor and Not Living with Parent(s), Who has Custody? No data recorded Is CPS involved or ever been involved? Never  Is APS involved or ever been involved? Never   Patient Determined To Be At Risk for Harm To Self or Others Based on Review of Patient Reported Information or Presenting Complaint? No  Method: No data recorded Availability of Means: No data recorded Intent: No data recorded Notification Required: No data recorded Additional Information for Danger to Others Potential: No data recorded Additional Comments for Danger to Others Potential: No data recorded Are There Guns or Other Weapons in Your Home? No data recorded Types of Guns/Weapons: No data recorded Are These Weapons Safely Secured?                            No data recorded Who Could Verify You Are Able To Have These Secured: No data recorded Do You Have any Outstanding Charges, Pending Court Dates,  Parole/Probation? No data recorded Contacted To Inform of Risk of Harm To Self or Others: No data recorded  Location of Assessment: [facility] ED   Does Patient Present under Involuntary Commitment? Yes  IVC Papers Initial File Date: 12/30/21   [place] of Residence: [place]   Patient Currently Receiving the Following Services: Not Receiving Services   Determination of Need: Emergent (2 hours)   Options For Referral: ED Visit; Inpatient Hospitalization; Outpatient Therapy; Medication Management     CCA Biopsychosocial Intake/Chief Complaint:  No data recorded Current Symptoms/Problems: No data recorded  Patient Reported Schizophrenia/Schizoaffective Diagnosis in Past: No   Strengths: Patient is able to communicate her needs.  Preferences: No data recorded Abilities: No data recorded  Type of Services Patient Feels are Needed: No data recorded  Initial Clinical Notes/Concerns: No data recorded  Mental Health Symptoms Depression:   Change in energy/activity   Duration of Depressive symptoms:  Greater than two weeks   Mania:   Increased Energy; Racing thoughts   Anxiety:    Restlessness   Psychosis:   Grossly disorganized or catatonic behavior; Grossly disorganized speech   Duration of Psychotic symptoms:  Less than six months   Trauma:   None   Obsessions:   None   Compulsions:   None   Inattention:   Disorganized   Hyperactivity/Impulsivity:   None   Oppositional/Defiant Behaviors:   None   Emotional Irregularity:   None   Other Mood/Personality Symptoms:  No data recorded   Mental Status Exam Appearance and self-care  Stature:   Average   Weight:   Average weight   Clothing:   Disheveled   Grooming:   Normal   Cosmetic use:   None   Posture/gait:   Normal   Motor activity:   Not Remarkable   Sensorium  Attention:   Confused   Concentration:   Focuses on irrelevancies; Scattered   Orientation:   Person    Recall/memory:   Normal   Affect and Mood  Affect:   Appropriate   Mood:   Anxious   Relating  Eye contact:   Staring   Facial expression:   Anxious   Attitude toward examiner:   Cooperative   Thought and Language  Speech flow:  Paucity; Blocked   Thought content:   Appropriate to Mood and Circumstances   Preoccupation:   None   Hallucinations:   None   Organization:  No data recorded  [employer] of Knowledge:   Fair   Intelligence:   Average   Abstraction:   Normal   Judgement:   Impaired   Reality Testing:   Adequate   Insight:   Lacking   Decision Making:   Impulsive   Social Functioning  Social Maturity:   Irresponsible   Social Judgement:   Heedless   Stress  Stressors:   Family conflict   Coping Ability:   Exhausted   Skill Deficits:   Communication   Supports:   Family     Religion:    Leisure/Recreation:    Exercise/Diet:     CCA Employment/Education Employment/Work Situation: Employment / Work Situation Employment Situation: Employed Work Stressors: None reported Has Patient ever Been in Passenger transport manager?:  (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.)  Education:     CCA Family/Childhood History Family and Relationship History: Family history Marital status: Single Does patient have children?: No  Childhood History:     Child/Adolescent Assessment:     CCA Substance Use Alcohol/Drug Use: Alcohol / Drug Use Pain Medications: See MAR Prescriptions: See MAR Over the Counter: See MAR History of alcohol / drug use?: Yes Longest period of sobriety (when/how long): Unknown Negative Consequences of Use: Personal relationships, Museum/gallery curator, Work / School Substance #1 Name of Substance 1: Marijuana                       ASAM's:  Six Dimensions of Multidimensional Assessment  Dimension 1:  Acute Intoxication and/or Withdrawal Potential:      Dimension 2:  Biomedical Conditions and  Complications:      Dimension 3:  Emotional, Behavioral, or Cognitive Conditions and Complications:     Dimension 4:  Readiness to Change:     Dimension 5:  Relapse, Continued use, or Continued Problem Potential:     Dimension 6:  Recovery/Living Environment:     ASAM Severity Score:    ASAM Recommended Level of Treatment:     Substance use Disorder (SUD)    Recommendations for Services/Supports/Treatments:    DSM5 Diagnoses: Patient Active Problem List   Diagnosis Date Noted   Psychosis ([place]) 12/20/2022   Anxiety 12/19/2022  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

## 2022-12-20 NOTE — ED Notes (Signed)
Pt's sister at the bedside for visit

## 2022-12-20 NOTE — ED Provider Notes (Signed)
Patient had another shaking spell when being transferred over to the quad.  Resolved within 2 minutes.  Refusing to answer my questions.  Maintaining airway.  Labs in process.  Fingerstick glucose checked in the 100s.   Lucillie Garfinkel, MD 12/20/22 [phone]

## 2022-12-21 ENCOUNTER — Inpatient Hospital Stay
Admission: AD | Admit: 2022-12-21 | Discharge: 2022-12-23 | DRG: 885 | Disposition: A | Discharge status: Home / Self Care | Source: Intra-hospital | Attending: Psychiatry | Admitting: Psychiatry

## 2022-12-21 ENCOUNTER — Other Ambulatory Visit: Payer: Self-pay

## 2022-12-21 ENCOUNTER — Encounter: Payer: Self-pay | Admitting: Psychiatry

## 2022-12-21 DIAGNOSIS — F23 Brief psychotic disorder: Principal | ICD-10-CM | POA: Insufficient documentation

## 2022-12-21 DIAGNOSIS — F419 Anxiety disorder, unspecified: Secondary | ICD-10-CM | POA: Diagnosis present

## 2022-12-21 DIAGNOSIS — Z1152 Encounter for screening for COVID-19: Secondary | ICD-10-CM

## 2022-12-21 DIAGNOSIS — F29 Unspecified psychosis not due to a substance or known physiological condition: Secondary | ICD-10-CM | POA: Diagnosis not present

## 2022-12-21 LAB — RESP PANEL BY RT-PCR (RSV, FLU A&B, COVID)  RVPGX2
Influenza A by PCR: NEGATIVE
Influenza B by PCR: NEGATIVE
Resp Syncytial Virus by PCR: NEGATIVE
SARS Coronavirus 2 by RT PCR: NEGATIVE

## 2022-12-21 LAB — URINE DRUG SCREEN, QUALITATIVE (ARMC ONLY)
Amphetamines, Ur Screen: NOT DETECTED
Barbiturates, Ur Screen: NOT DETECTED
Benzodiazepine, Ur Scrn: POSITIVE — AB
Cannabinoid 50 Ng, Ur ~~LOC~~: POSITIVE — AB
Cocaine Metabolite,Ur ~~LOC~~: NOT DETECTED
MDMA (Ecstasy)Ur Screen: NOT DETECTED
Methadone Scn, Ur: NOT DETECTED
Opiate, Ur Screen: NOT DETECTED
Phencyclidine (PCP) Ur S: NOT DETECTED
Tricyclic, Ur Screen: NOT DETECTED

## 2022-12-21 LAB — POC URINE PREG, ED: Preg Test, Ur: NEGATIVE

## 2022-12-21 MED ORDER — LORAZEPAM 2 MG/ML IJ SOLN: 2.0000 mg | Freq: Two times a day (BID) | INTRAMUSCULAR | Status: AC | PRN

## 2022-12-21 MED ORDER — DIPHENHYDRAMINE HCL 50 MG/ML IJ SOLN: 50.0000 mg | Freq: Three times a day (TID) | INTRAMUSCULAR | Status: DC | PRN

## 2022-12-21 MED ORDER — LORAZEPAM 2 MG PO TABS: 2.0000 mg | ORAL_TABLET | Freq: Two times a day (BID) | ORAL | Status: AC | PRN

## 2022-12-21 MED ORDER — OLANZAPINE 5 MG PO TABS
5.0000 mg | ORAL_TABLET | Freq: Every day | ORAL | Status: DC
  Administered 2022-12-22: 5 mg via ORAL
  Filled 2022-12-21: qty 1

## 2022-12-21 MED ORDER — MAGNESIUM HYDROXIDE 400 MG/5ML PO SUSP: 30.0000 mL | Freq: Every day | ORAL | Status: DC | PRN

## 2022-12-21 MED ORDER — HALOPERIDOL 5 MG PO TABS: 5.0000 mg | ORAL_TABLET | Freq: Two times a day (BID) | ORAL | Status: AC | PRN

## 2022-12-21 MED ORDER — HYDROXYZINE HCL 25 MG PO TABS: 25.0000 mg | ORAL_TABLET | Freq: Three times a day (TID) | ORAL | Status: DC | PRN

## 2022-12-21 MED ORDER — ACETAMINOPHEN 325 MG PO TABS: 650.0000 mg | ORAL_TABLET | Freq: Four times a day (QID) | ORAL | Status: DC | PRN

## 2022-12-21 MED ORDER — ALUM & MAG HYDROXIDE-SIMETH 200-200-20 MG/5ML PO SUSP: 30.0000 mL | ORAL | Status: DC | PRN

## 2022-12-21 MED ORDER — DIPHENHYDRAMINE HCL 25 MG PO CAPS: 50.0000 mg | ORAL_CAPSULE | Freq: Three times a day (TID) | ORAL | Status: DC | PRN

## 2022-12-21 MED ORDER — HALOPERIDOL LACTATE 5 MG/ML IJ SOLN: 5.0000 mg | Freq: Two times a day (BID) | INTRAMUSCULAR | Status: AC | PRN

## 2022-12-21 NOTE — ED Notes (Signed)
This RN asked twice if pt wanted a lunch tray and something to drink. Pt did not respond either time

## 2022-12-21 NOTE — ED Notes (Signed)
Pt has been informed numerous times that she is being admitted.  This rn has spoken with pt's mother and she is aware of admission status.

## 2022-12-21 NOTE — ED Notes (Signed)
Pt with visitor

## 2022-12-21 NOTE — ED Notes (Signed)
Pt resting quietly. Pt waiting for admission.

## 2022-12-21 NOTE — ED Notes (Signed)
Pt awake at this time. Asking to speak to mother. Pt educated on the phone and visitation rules.

## 2022-12-21 NOTE — Progress Notes (Signed)
Patient admitted to unit, alert and orient. Denies any SI, HI, AVH. Reports she is here due to some edible gummies she had eaten and non compliance with her medications. Patient reports previously on Zyprexa but was unsure of the dosage she was supposed to take. Reports she was unsure of the 2.'5mg'$  vs '5mg'$  so she did not take either. Reports she realizes that she needs those medications. Patient also reports breaking up with boyfriend and this has been extremely stressful to her getting away from him. Patient is calm and cooperative during assessment. Logical and coherent speech. Reports had not slept for along time.  Oriented patient to room and unit. Skin and contraband search completed and witnessed by [place], Rn. No skin issues noted no contraband found. Patient remains safe on unit with q 15 min checks.

## 2022-12-21 NOTE — ED Notes (Signed)
Pt again asking to call father. This tech informed pt again that she had already had her phone call for this time block. Pt stating that she needs to call her father to come here. Pt informed that father cannot visit her or come to pick her up due to admission status and visiting hours being over. Pt still not stating understanding of her situation.

## 2022-12-21 NOTE — Tx Team (Signed)
Initial Treatment Plan 12/21/2022 10:53 PM Robin Ross [id number]    PATIENT STRESSORS: Medication change or noncompliance   Substance abuse   Other: break up with boyfriend     PATIENT STRENGTHS: Marketing executive fund of knowledge  Motivation for treatment/growth    PATIENT IDENTIFIED PROBLEMS: Substance Abuse  Medication non compliance                   DISCHARGE CRITERIA:  Improved stabilization in mood, thinking, and/or behavior  PRELIMINARY DISCHARGE PLAN: Outpatient therapy  PATIENT/FAMILY INVOLVEMENT: This treatment plan has been presented to and reviewed with the patient, Robin Ross, and/or family member, .  The patient and family have been given the opportunity to ask questions and make suggestions.  Floyde Parkins, RN 12/21/2022, 10:53 PM

## 2022-12-21 NOTE — ED Notes (Signed)
Pt had phone call at this time.

## 2022-12-21 NOTE — ED Notes (Signed)
ivc/pending consult.Marland KitchenMarland Kitchen

## 2022-12-21 NOTE — ED Notes (Signed)
Report called to Jonelle Sidle bmu nurse

## 2022-12-21 NOTE — BH Assessment (Signed)
Patient is to be admitted to [place] 12/21/22 after 7:30 pm by Dr. Weber Cooks.  Attending Physician will be Dr.  Weber Cooks .   Patient has been assigned to room 306, by [facility] Charge Nurse, Ersilia Brawley.    ER staff is aware of the admission: Adriane, ER Secretary   Dr. Archie Balboa, ER MD  Lanelle Bal, Patient's Nurse

## 2022-12-21 NOTE — ED Notes (Signed)
Pt received meal tray with beverage at this time.

## 2022-12-21 NOTE — ED Notes (Signed)
Pt offered phone to return phone call to mother, pt refused

## 2022-12-21 NOTE — ED Notes (Signed)
IVC/Pt accepted to [facility] after 7:30pm on 12/21/22

## 2022-12-21 NOTE — ED Notes (Signed)
Pt asking for phone again at this time. Pt informed of the policies for the quad. Pt has been told multiple times by the nurse that she will be admitted. Pt came up to this tech stating "I am leaving right?" When informing this pt again that she is being admitted, pt stated "I don't understand, I am fine." Then pt began crying loudly and yelling that she needed to see her Dad. Pt calmed by Psych NP, Louise at this time. Pt asked again to call father. Pt informed again by this tech that she would be unable to call her father at this time.

## 2022-12-21 NOTE — ED Notes (Signed)
Pt visitor brought in pink duffel bag.

## 2022-12-21 NOTE — ED Notes (Signed)
Pt again asking about calling father to come to the hospital. This tech again explained to pt that phone calls were over for the day and she would be unable to call her father. Pts father is not listed as an approved contact for the patient. Pt keeps going around in circles with this tech stating unfairness. This tech has informed pt multiple times of the rules and has received her phone call for this phone block.

## 2022-12-21 NOTE — ED Notes (Signed)
Family updated as to patient's status due to sister continuously calling since 2pm for update by assigned RN. Pt allowed to speak with sister, [place],Cherelle, by phone to provide update on plan of care/admission with policy and procedures for new area.

## 2022-12-22 ENCOUNTER — Encounter: Payer: Self-pay | Admitting: Psychiatry

## 2022-12-22 ENCOUNTER — Other Ambulatory Visit: Payer: Self-pay

## 2022-12-22 DIAGNOSIS — F23 Brief psychotic disorder: Principal | ICD-10-CM | POA: Insufficient documentation

## 2022-12-22 MED ORDER — HYDROXYZINE HCL 50 MG PO TABS: 50.0000 mg | ORAL_TABLET | Freq: Four times a day (QID) | ORAL | Status: DC | PRN

## 2022-12-22 MED ORDER — OLANZAPINE 5 MG PO TABS
5.0000 mg | ORAL_TABLET | Freq: Every day | ORAL | 0 refills | Status: DC
  Filled 2022-12-22: qty 10, 10d supply, fill #0

## 2022-12-22 NOTE — Progress Notes (Signed)
Pt denies SI/HI/AVH and verbally agrees to approach staff if these become apparent or before harming themselves/others. Rates depression 0/10. Rates anxiety 0/10. Rates pain 0/10.  Pt was coming up to the nurses station frequently in the morning. Pt was adamant about seeing the MD and when she was going to leave. Pt was reassured that she needed to be patient and wait for the MD to come get her during his rounds. Writer told her that I would message the MD and let him know she wanted to see him. After pt spoke with the MD, the pt banged on the nurses station door and started to cry. Pt was explaining the MD told her not today but likely tomorrow. Pt started to yell and cry loudly that she would miss her hair appointment. Writer walked pt to room and reassured the pt. The pt calmed down and has been okay the rest of the day. Pt did lie to RN when trying to convince writer to let her use her phone to contact her hairstylist through instagram. Pt told she will have to wait till she leaves. Pt accepted information. Scheduled medications administered to pt, per MD orders. RN provided support and encouragement to pt. Q15 min safety checks implemented and continued. Pt is safe on the unit. Plan of care on going and no other concerns expressed at this time.  12/22/22 0800  Psych Admission Type (Psych Patients Only)  Admission Status Involuntary  Psychosocial Assessment  Patient Complaints None  Eye Contact Fair  Facial Expression Other (Comment) (WDL)  Affect Preoccupied  Speech Logical/coherent  Interaction Needy  Motor Activity Other (Comment) (WDL)  Appearance/Hygiene Unremarkable  Behavior Characteristics Cooperative;Appropriate to situation;Calm  Mood Preoccupied;Pleasant  Aggressive Behavior  Effect No apparent injury  Thought Process  Coherency WDL  Content Blaming others;Preoccupation  Delusions None reported or observed  Perception WDL  Hallucination None reported or observed  Judgment WDL   Confusion None  Danger to Self  Current suicidal ideation? Denies  Danger to Others  Danger to Others None reported or observed

## 2022-12-22 NOTE — H&P (Signed)
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  27 year old woman brought to the emergency room on March 11 after having been discharged just a couple days previously.  Patient brought in after family found her with altered mental state confused and possibly after having had a seizure.  Patient was confused and agitated and with disorganized speech and odd affect in the emergency room.  Patient had endorsed having used cannabis products recently.  There was no evidence of intentional self-harm and patient has denied suicidal or homicidal ideation.  On interview today the patient is alert and neatly dressed and cooperative.  Denies any hallucinations or psychotic symptoms.  Denies suicidal or homicidal ideation.  Affect still somewhat anxious but controlled.  Patient claims that she had not slept for 2 or 3 days.  She does not have a very clear reason for this.  She does say that she recently broke up with her boyfriend.  She does not remember having had a seizure.  She does say that she had not been taking olanzapine and does not remember how long it had been since she was last taking any psychiatric medicine.  Denied any drug use other than edible cannabis. Associated Signs/Symptoms: Depression Symptoms:  insomnia, anxiety, (Hypo) Manic Symptoms:  Labiality of Mood, Anxiety Symptoms:  Excessive Worry, Psychotic Symptoms:   Disorganized thinking and bizarre behavior PTSD Symptoms: Negative Total Time spent with patient: 45 minutes  Past Psychiatric History: Patient has a past history of very similar episodes.  At least one previously had been thought to be possibly related also to acute drug use.   Patient was prescribed olanzapine at [facility] but never followed up with outpatient providers.  She has a diagnosis of "nonepileptic seizures".  Has been seen by a neurologist.  No evidence that she requires any anticonvulsant medicine.  No history of suicide attempts or violence.  Is the patient at risk to self? Yes.    Has the patient been a risk to self in the past 6 months? No.  Has the patient been a risk to self within the distant past? No.  Is the patient a risk to others? No.  Has the patient been a risk to others in the past 6 months? No.  Has the patient been a risk to others within the distant past? No.   [place] Scale:  [place] Admission (Current) from 12/21/2022 in [place] ED from 12/20/2022 in [facility] Emergency Department at [facility] ED from 12/18/2022 in [facility] Emergency Department at [place] No Risk No Risk No Risk        Prior Inpatient Therapy: Yes.   If yes, describe at least 1 prior hospitalization about a year ago at [facility] Prior Outpatient Therapy: No. If yes, describe did not follow-up with outpatient mental health treatment  Alcohol Screening: 1. How often do you have a drink containing alcohol?: Monthly or less 2. How many drinks containing alcohol do you have on a typical day when you are drinking?: 1 or 2 3. How often do you have six or more drinks on one occasion?: Never AUDIT-C Score: 1 4. How often during the last year have you found that you were not able  to stop drinking once you had started?: Never 5. How often during the last year have you failed to do what was normally expected from you because of drinking?: Never 6. How often during the last year have you needed a first drink in the morning to get yourself going after a heavy drinking session?: Never 7. How often during the last year have you had a feeling of guilt of remorse after drinking?: Never 8. How often  during the last year have you been unable to remember what happened the night before because you had been drinking?: Never 9. Have you or someone else been injured as a result of your drinking?: No 10. Has a relative or friend or a doctor or another health worker been concerned about your drinking or suggested you cut down?: No Alcohol Use Disorder Identification Test Final Score (AUDIT): 1 Alcohol Brief Interventions/Follow-up: Alcohol education/Brief advice Substance Abuse History in the last 12 months:  Yes.   Consequences of Substance Abuse: Patient claims she uses cannabis edible products only very occasionally.  Will not be any more clear than that about the total amount.  Denies that she has been using any other drugs recently. Previous Psychotropic Medications: Yes  Psychological Evaluations: Yes  Past Medical History:  Past Medical History:  Diagnosis Date   Anxiety    History reviewed. No pertinent surgical history. Family History: History reviewed. No pertinent family history. Family Psychiatric  History: None reported Tobacco Screening:  Social History   Tobacco Use  Smoking Status Never  Smokeless Tobacco Not on file    [place] Tobacco Counseling     Are you interested in Tobacco Cessation Medications?  N/A, patient does not use tobacco products Counseled patient on smoking cessation:  N/A, patient does not use tobacco products Reason Tobacco Screening Not Completed: No value filed.       Social History:  Social History   Substance and Sexual Activity  Alcohol Use Not Currently   Comment: occasionally     Social History   Substance and Sexual Activity  Drug Use Yes   Types: Marijuana    Additional Social History:                           Allergies:  No Known Allergies Lab Results:  Results for orders placed or performed during the hospital encounter of 12/20/22 (from the past 48 hour(s))  Comprehensive metabolic panel     Status: Abnormal    Collection Time: 12/20/22  1:40 PM  Result Value Ref Range   Sodium 135 135 - 145 mmol/L   Potassium 4.0 3.5 - 5.1 mmol/L   Chloride 105 98 - 111 mmol/L   CO2 19 (L) 22 - 32 mmol/L   Glucose, Bld 122 (H) 70 - 99 mg/dL    Comment: Glucose reference range applies only to samples taken after fasting for at least 8 hours.   BUN 10 6 - 20 mg/dL   Creatinine, Ser 1.01 (H) 0.44 - 1.00 mg/dL   Calcium 9.6 8.9 - 10.3 mg/dL   Total Protein 9.3 (H) 6.5 - 8.1 g/dL   Albumin 4.8 3.5 - 5.0 g/dL   AST 34 15 - 41 U/L   ALT 10 0 - 44 U/L   Alkaline Phosphatase 86 38 - 126 U/L   Total Bilirubin 1.0 0.3 - 1.2 mg/dL   GFR, Estimated >60 >60 mL/min    Comment: (NOTE) Calculated using the CKD-EPI Creatinine Equation (2021)  Anion gap 12 5 - 15    Comment: Performed at [facility], [address] [place], [place] [address]  Ethanol     Status: None   Collection Time: 12/20/22  1:40 PM  Result Value Ref Range   Alcohol, Ethyl (B) <10 <10 mg/dL    Comment: (NOTE) Lowest detectable limit for serum alcohol is 10 mg/dL.  For medical purposes only. Performed at [facility], [address] [place], [place] [address]   CBC with Diff     Status: Abnormal   Collection Time: 12/20/22  1:40 PM  Result Value Ref Range   WBC 5.9 4.0 - 10.5 K/uL   RBC 4.00 3.87 - 5.11 MIL/uL   Hemoglobin 11.0 (L) 12.0 - 15.0 g/dL   HCT 35.8 (L) 36.0 - 46.0 %   MCV 89.5 80.0 - 100.0 fL   MCH 27.5 26.0 - 34.0 pg   MCHC 30.7 30.0 - 36.0 g/dL   RDW 13.5 11.5 - 15.5 %   Platelets 261 150 - 400 K/uL   nRBC 0.0 0.0 - 0.2 %   Neutrophils Relative % 73 %   Neutro Abs 4.3 1.7 - 7.7 K/uL   Lymphocytes Relative 13 %   Lymphs Abs 0.8 0.7 - 4.0 K/uL   Monocytes Relative 13 %   Monocytes Absolute 0.8 0.1 - 1.0 K/uL   Eosinophils Relative 0 %   Eosinophils Absolute 0.0 0.0 - 0.5 K/uL   Basophils Relative 0 %   Basophils Absolute 0.0 0.0 - 0.1 K/uL   Immature Granulocytes 1 %   Abs Immature Granulocytes  0.03 0.00 - 0.07 K/uL    Comment: Performed at [facility], [address] [place], [place] [address]  Magnesium     Status: None   Collection Time: 12/20/22  1:40 PM  Result Value Ref Range   Magnesium 2.3 1.7 - 2.4 mg/dL    Comment: Performed at [facility], [address] [place], [place] [address]  CBG monitoring, ED     Status: Abnormal   Collection Time: 12/20/22  4:26 PM  Result Value Ref Range   Glucose-Capillary 141 (H) 70 - 99 mg/dL    Comment: Glucose reference range applies only to samples taken after fasting for at least 8 hours.  Resp panel by RT-PCR (RSV, Flu A&B, Covid) Anterior Nasal Swab     Status: None   Collection Time: 12/21/22  3:49 AM   Specimen: Anterior Nasal Swab  Result Value Ref Range   SARS Coronavirus 2 by RT PCR NEGATIVE NEGATIVE    Comment: (NOTE) SARS-CoV-2 target nucleic acids are NOT DETECTED.  The SARS-CoV-2 RNA is generally detectable in upper respiratory specimens during the acute phase of infection. The lowest concentration of SARS-CoV-2 viral copies this assay can detect is 138 copies/mL. A negative result does not preclude SARS-Cov-2 infection and should not be used as the sole basis for treatment or other patient management decisions. A negative result may occur with  improper specimen collection/handling, submission of specimen other than nasopharyngeal swab, presence of viral mutation(s) within the areas targeted by this assay, and inadequate number of viral copies(<138 copies/mL). A negative result must be combined with clinical observations, patient history, and epidemiological information. The expected result is Negative.  Fact Sheet for Patients:  EntrepreneurPulse.com.au  Fact Sheet for Healthcare Providers:  IncredibleEmployment.be  This test is no t yet approved or cleared by the [place] FDA and  has been authorized for detection and/or diagnosis  of SARS-CoV-2  by FDA under an Emergency Use Authorization (EUA). This EUA will remain  in effect (meaning this test can be used) for the duration of the COVID-19 declaration under Section 564(b)(1) of the Act, 21 U.S.C.section 360bbb-3(b)(1), unless the authorization is terminated  or revoked sooner.       Influenza A by PCR NEGATIVE NEGATIVE   Influenza B by PCR NEGATIVE NEGATIVE    Comment: (NOTE) The Xpert Xpress SARS-CoV-2/FLU/RSV plus assay is intended as an aid in the diagnosis of influenza from Nasopharyngeal swab specimens and should not be used as a sole basis for treatment. Nasal washings and aspirates are unacceptable for Xpert Xpress SARS-CoV-2/FLU/RSV testing.  Fact Sheet for Patients: EntrepreneurPulse.com.au  Fact Sheet for Healthcare Providers: IncredibleEmployment.be  This test is not yet approved or cleared by the [place] FDA and has been authorized for detection and/or diagnosis of SARS-CoV-2 by FDA under an Emergency Use Authorization (EUA). This EUA will remain in effect (meaning this test can be used) for the duration of the COVID-19 declaration under Section 564(b)(1) of the Act, 21 U.S.C. section 360bbb-3(b)(1), unless the authorization is terminated or revoked.     Resp Syncytial Virus by PCR NEGATIVE NEGATIVE    Comment: (NOTE) Fact Sheet for Patients: EntrepreneurPulse.com.au  Fact Sheet for Healthcare Providers: IncredibleEmployment.be  This test is not yet approved or cleared by the [place] FDA and has been authorized for detection and/or diagnosis of SARS-CoV-2 by FDA under an Emergency Use Authorization (EUA). This EUA will remain in effect (meaning this test can be used) for the duration of the COVID-19 declaration under Section 564(b)(1) of the Act, 21 U.S.C. section 360bbb-3(b)(1), unless the authorization is terminated or revoked.  Performed at [facility],  [address] [place], [place] [address]   Urine Drug Screen, Qualitative     Status: Abnormal   Collection Time: 12/21/22  5:16 AM  Result Value Ref Range   Tricyclic, Ur Screen NONE DETECTED NONE DETECTED   Amphetamines, Ur Screen NONE DETECTED NONE DETECTED   MDMA (Ecstasy)Ur Screen NONE DETECTED NONE DETECTED   Cocaine Metabolite,Ur [place] NONE DETECTED NONE DETECTED   Opiate, Ur Screen NONE DETECTED NONE DETECTED   Phencyclidine (PCP) Ur S NONE DETECTED NONE DETECTED   Cannabinoid 50 Ng, Ur [place] POSITIVE (A) NONE DETECTED   Barbiturates, Ur Screen NONE DETECTED NONE DETECTED   Benzodiazepine, Ur Scrn POSITIVE (A) NONE DETECTED   Methadone Scn, Ur NONE DETECTED NONE DETECTED    Comment: (NOTE) Tricyclics + metabolites, urine    Cutoff 1000 ng/mL Amphetamines + metabolites, urine  Cutoff 1000 ng/mL MDMA (Ecstasy), urine              Cutoff 500 ng/mL Cocaine Metabolite, urine          Cutoff 300 ng/mL Opiate + metabolites, urine        Cutoff 300 ng/mL Phencyclidine (PCP), urine         Cutoff 25 ng/mL Cannabinoid, urine                 Cutoff 50 ng/mL Barbiturates + metabolites, urine  Cutoff 200 ng/mL Benzodiazepine, urine              Cutoff 200 ng/mL Methadone, urine                   Cutoff 300 ng/mL  The urine drug screen provides only a preliminary, unconfirmed analytical test result and should not be used for non-medical  purposes. Clinical consideration and professional judgment should be applied to any positive drug screen result due to possible interfering substances. A more specific alternate chemical method must be used in order to obtain a confirmed analytical result. Gas chromatography / mass spectrometry (GC/MS) is the preferred confirm atory method. Performed at [facility], [address] [place], [place] [address]   POC urine preg, ED     Status: None   Collection Time: 12/21/22  5:18 AM  Result Value Ref Range   Preg Test, Ur Negative Negative     Blood Alcohol level:  Lab Results  Component Value Date   ETH <10 12/20/2022   ETH <10 [id number]    Metabolic Disorder Labs:  No results found for: "HGBA1C", "MPG" No results found for: "PROLACTIN" No results found for: "CHOL", "TRIG", "HDL", "CHOLHDL", "VLDL", "LDLCALC"  Current Medications: Current Facility-Administered Medications  Medication Dose Route Frequency Provider Last Rate Last Admin   acetaminophen (TYLENOL) tablet 650 mg  650 mg Oral Q6H PRN Sherlon Handing, NP       alum & mag hydroxide-simeth (MAALOX/MYLANTA) 200-200-20 MG/5ML suspension 30 mL  30 mL Oral Q4H PRN Sherlon Handing, NP       diphenhydrAMINE (BENADRYL) capsule 50 mg  50 mg Oral TID PRN Sherlon Handing, NP       Or   diphenhydrAMINE (BENADRYL) injection 50 mg  50 mg Intramuscular TID PRN Sherlon Handing, NP       haloperidol (HALDOL) tablet 5 mg  5 mg Oral BID PRN Sherlon Handing, NP       Or   haloperidol lactate (HALDOL) injection 5 mg  5 mg Intramuscular BID PRN Sherlon Handing, NP       hydrOXYzine (ATARAX) tablet 50 mg  50 mg Oral Q6H PRN Vernadine Coombs, Madie Reno, MD       LORazepam (ATIVAN) tablet 2 mg  2 mg Oral BID PRN Sherlon Handing, NP       Or   LORazepam (ATIVAN) injection 2 mg  2 mg Intramuscular BID PRN Waldon Merl F, NP       magnesium hydroxide (MILK OF MAGNESIA) suspension 30 mL  30 mL Oral Daily PRN Sherlon Handing, NP       OLANZapine (ZYPREXA) tablet 5 mg  5 mg Oral QHS Waldon Merl F, NP       PTA Medications: Medications Prior to Admission  Medication Sig Dispense Refill Last Dose   potassium chloride SA (KLOR-CON M) 20 MEQ tablet Take 1 tablet (20 mEq total) by mouth 2 (two) times daily for 5 days. 10 tablet 0 Past Week   Levonorgestrel-Ethinyl Estradiol (AMETHIA,CAMRESE) 0.15-0.03 &0.01 MG tablet Take 1 tablet by mouth daily. (Patient not taking: Reported on 12/18/2022) 1 Package 4 Not Taking    Musculoskeletal: Strength & Muscle Tone: within  normal limits Gait & Station: normal Patient leans: N/A            Psychiatric Specialty Exam:  Presentation  General Appearance:  Bizarre  Eye Contact: Good  Speech: Blocked  Speech Volume: Normal  Handedness: Right   Mood and Affect  Mood: Dysphoric (UTA; inappropriate)  Affect: Inappropriate   Thought Process  Thought Processes: Disorganized  Duration of Psychotic Symptoms: Symptoms seem to have only been present for about the last 4 days.  Has had intermittent symptoms in the past. Past Diagnosis of Schizophrenia or Psychoactive disorder: No  Descriptions of Associations:Tangential  Orientation:Partial  Thought Content:Illogical  Hallucinations:No  data recorded Ideas of Reference:-- (UTA)  Suicidal Thoughts:No data recorded Homicidal Thoughts:No data recorded  Sensorium  Memory: Immediate Poor; Recent Poor  Judgment: Impaired  Insight: Lacking   Executive Functions  Concentration: Poor  Attention Span: Poor  Recall: Poor  Fund of Knowledge: Poor  Language: Poor   Psychomotor Activity  Psychomotor Activity:No data recorded  Assets  Assets: Vocational/Educational; Physical Health; Social Support; Catering manager; Housing; Desire for Improvement   Sleep  Sleep:No data recorded   Physical Exam: Physical Exam Vitals and nursing note reviewed.  Constitutional:      Appearance: Normal appearance.  HENT:     Head: Normocephalic and atraumatic.     Mouth/Throat:     Pharynx: Oropharynx is clear.  Eyes:     Pupils: Pupils are equal, round, and reactive to light.  Cardiovascular:     Rate and Rhythm: Normal rate and regular rhythm.  Pulmonary:     Effort: Pulmonary effort is normal.     Breath sounds: Normal breath sounds.  Abdominal:     General: Abdomen is flat.     Palpations: Abdomen is soft.  Musculoskeletal:        General: Normal range of motion.  Skin:    General: Skin is warm and  dry.  Neurological:     General: No focal deficit present.     Mental Status: She is alert. Mental status is at baseline.  Psychiatric:        Attention and Perception: Attention normal.        Mood and Affect: Mood is anxious.        Speech: Speech normal.        Behavior: Behavior normal.        Thought Content: Thought content normal.        Cognition and Memory: Cognition normal.        Judgment: Judgment is impulsive.    Review of Systems  Constitutional: Negative.   HENT: Negative.    Eyes: Negative.   Respiratory: Negative.    Cardiovascular: Negative.   Gastrointestinal: Negative.   Musculoskeletal: Negative.   Skin: Negative.   Neurological: Negative.   Psychiatric/Behavioral: Negative.     Blood pressure (!) 147/125, pulse 86, temperature 98.5 F (36.9 C), temperature source Oral, resp. rate 18, height '5\' 4"'$  (1.626 m), weight 62.1 kg, last menstrual period 12/17/2022, SpO2 100 %. Body mass index is 23.52 kg/m.  Treatment Plan Summary: Daily contact with patient to assess and evaluate symptoms and progress in treatment, Medication management, and Plan differential diagnosis includes brief reactive psychosis, substance induced psychotic symptoms and bipolar disorder.  Patient apparently return to normal functioning even off medicine in the interim between last year and this year's presentation.  I suggest to her that we continue the olanzapine at 5 mg for now.  I would like to see her overnight to make sure she is stable but if so we can plan on discharge tomorrow.  Psychoeducation provided and recommended patient follow up with outpatient mental health treatment.  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[place]) Long Term Goal(s): Improvement in symptoms  so as ready for discharge  Short Term Goals: Ability to verbalize feelings will improve, Ability to demonstrate self-control will improve, and Ability to identify  and develop effective coping behaviors will improve  Physician Treatment Plan for Secondary Diagnosis: Principal Problem:   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    Alethia Berthold, MD 3/13/20241:23 PM

## 2022-12-22 NOTE — BHH Counselor (Signed)
Adult Comprehensive Assessment  Patient ID: Robin Ross, female   DOB: 1996-04-10, 27 y.o.   MRN: [id number]  Information Source: Information source: Patient  Current Stressors:  Patient states their primary concerns and needs for treatment are:: During assessment, patient states she has been sleep deprived and going through a difficult breakup; symptoms began when patient used edible cannabis day prior to admission. Currently, patient is primarily interested in  discharging in order to make court hearing; reports she is suing someone for lost wages due to harmed reputation. Patient states their goals for this hospitilization and ongoing recovery are:: States her goal for treatment is to discharge to home. 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 Social relationships: none reported Substance abuse: reports use of cannabis (edibles) Bereavement / Loss: none reported  Living/Environment/Situation:  Living Arrangements: Alone Living conditions (as described by patient or guardian): states living conditions are WNL Who else lives in the home?: patient lives alone How long has patient lived in current situation?: 3 years  Family History:  Marital status: Single Additional relationship information: recent breakup "a cople of days ago" leading to anxiety Are you sexually active?: No What is your sexual orientation?: Heterosexual Does patient have children?: No  Childhood History:  By whom was/is the patient raised?: Both parents Description of patient's relationship with caregiver when they were a child: reports normative relaitonship wtih parents growing up Patient's description of current  relationship with people who raised him/her: States she has a good relationship wtih her parents as an adult Does patient have siblings?: Yes Number of Siblings: 5 Description of patient's current relationship with siblings: States she has a good relationshi but does not live near her siblings Did patient suffer any verbal/emotional/physical/sexual abuse as a child?: No Did patient suffer from severe childhood neglect?: No Has patient ever been sexually abused/assaulted/raped as an adolescent or adult?: No Was the patient ever a victim of a crime or a disaster?: No Witnessed domestic violence?: No Has patient been affected by domestic violence as an adult?: No  Education:  Highest grade of school patient has completed: HS Diploma; BS Currently a student?: No Learning disability?: No  Employment/Work Situation:   Employment Situation: Employed 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 How Long has Patient Been Employed?: 2 years Are You Satisfied With Your Job?: Yes Do You Work More Than One Job?: No Patient's Job has Been Impacted by Current Illness: No Has Patient ever Been in the [employer]?: Yes (Describe in comment) Did You Receive Any Psychiatric Treatment/Services While in the [employer]?: No  Financial Resources:   Financial resources: Income from employment Does patient have a Programmer, applications or guardian?: No  Alcohol/Substance Abuse:   Social History   Substance and Sexual Activity  Alcohol Use Not Currently   Comment: states she has stopped drinking alcohol "for a while now"   Social History   Substance and Sexual Activity  Drug Use Yes   Types: Marijuana   Comment: occasional cannabis use   Tobacco Use: Unknown (12/22/2022)   Patient History    Smoking Tobacco Use: Never    Smokeless Tobacco Use: Unknown    Passive Exposure: Not on file   If attempted suicide, did drugs/alcohol play a role in this?: No Alcohol/Substance Abuse Treatment Hx:  Denies  past history Has alcohol/substance abuse ever caused legal problems?: No  Social Support System:   Patient's Community Support System: Good Describe Community Support System: lists her family as supportive of her mental health Type of faith/religion: Darrick Meigs How does patient's faith help to cope with current illness?: patient attends church regularly  Leisure/Recreation:   Do You Have Hobbies?: Yes Leisure and Hobbies: rollerskating  Strengths/Needs:   Patient states these barriers may affect/interfere with their treatment: none reported Patient states these barriers may affect their return to the community: none reported Other important information patient would like considered in planning for their treatment: none reported  Discharge Plan:   Currently receiving community mental health services: No Does patient have access to transportation?: No Does patient have financial barriers related to discharge medications?: No (TRICARE) Will patient be returning to same living situation after discharge?: No  Summary/Recommendations:   Summary and Recommendations (to be completed by the evaluator): 27 y/o female w/ dx of brief psychotic episode from [facility] w/ [place] ins admitted due to brief psychotic episode. Patient was recently discharged from [facility]; per patient, she was admitted due to "siezure" activity induced by stress. During assessment, patient states she has been sleep deprived and going through a difficult breakup; symptoms began when patient used edible cannabis day prior to admission. Currently, patient is primarily interested in discharging in order to make court hearing; reports she is suing someone for lost wages due to harmed reputation.  Patient presents as calm, cooperative, and polite. Affect is Euthymic, congruent with mood and context. Appearance is WNL. Speech volume, speed, and content is WNL. No evidence of memory or concentration  impairment. Patient oriented to person, place, time, and situation. Currently denies SI, HI, AVH. No evidence of psychotic features present.    Patient is not currently associated with any outpatient mental health services; has signed consent for CSW team to make referral for outpatient services at the [place] in [place]. Therapeutic recommendations include further crisis stabilization, medication management, group therapy, and case management.   Durenda Hurt. 12/22/2022

## 2022-12-22 NOTE — Group Note (Signed)
LCSW Group Therapy Note   Group Date: 12/22/2022 Start Time: 1300 End Time: 1400   Type of Therapy and Topic:  Group Therapy: Boundaries  Participation Level:  Did Not Attend  Description of Group: This group will address the use of boundaries in their personal lives. Patients will explore why boundaries are important, the difference between healthy and unhealthy boundaries, and negative and postive outcomes of different boundaries and will look at how boundaries can be crossed.  Patients will be encouraged to identify current boundaries in their own lives and identify what kind of boundary is being set. Facilitators will guide patients in utilizing problem-solving interventions to address and correct types boundaries being used and to address when no boundary is being used. Understanding and applying boundaries will be explored and addressed for obtaining and maintaining a balanced life. Patients will be encouraged to explore ways to assertively make their boundaries and needs known to significant others in their lives, using other group members and facilitator for role play, support, and feedback.  Therapeutic Goals:  1.  Patient will identify areas in their life where setting clear boundaries could be  used to improve their life.  2.  Patient will identify signs/triggers that a boundary is not being respected. 3.  Patient will identify two ways to set boundaries in order to achieve balance in  their lives: 4.  Patient will demonstrate ability to communicate their needs and set boundaries  through discussion and/or role plays  Summary of Patient Progress:  X  Therapeutic Modalities:   Cognitive Behavioral Therapy Solution-Focused Therapy  Rozann Lesches, [place] 12/22/2022  2:14 PM

## 2022-12-22 NOTE — Plan of Care (Signed)
  Problem: Education: Goal: Knowledge of General Education information will improve Description: Including pain rating scale, medication(s)/side effects and non-pharmacologic comfort measures Outcome: Progressing   Problem: Activity: Goal: Risk for activity intolerance will decrease Outcome: Progressing   Problem: Nutrition: Goal: Adequate nutrition will be maintained Outcome: Progressing   Problem: Education: Goal: Knowledge of [place] General Education information/materials will improve Outcome: Progressing Goal: Emotional status will improve Outcome: Progressing   Problem: Clinical Measurements: Goal: Cardiovascular complication will be avoided Outcome: Not Progressing

## 2022-12-22 NOTE — BHH Suicide Risk Assessment (Signed)
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  No report of suicidal ideation or suicidal behavior.  No homicidal behavior.  On interview today the patient is reporting that she feels back to normal denies any acute mood or psychotic symptoms and has been compliant with medication  Continued Clinical Symptoms:  Alcohol Use Disorder Identification Test Final Score (AUDIT): 1 The "Alcohol Use Disorders Identification Test", Guidelines for Use in Primary Care, Second Edition.  World Pharmacologist [facility]). Score between 0-7:  no or low risk or alcohol related problems. Score between 8-15:  moderate risk of alcohol related problems. Score between 16-19:  high risk of alcohol related problems. Score 20 or above:  warrants further diagnostic evaluation for alcohol dependence and treatment.   CLINICAL FACTORS:   Severe Anxiety and/or Agitation   Musculoskeletal: Strength & Muscle Tone: within normal limits Gait & Station: normal Patient leans: N/A  Psychiatric Specialty Exam:  Presentation  General Appearance:  Bizarre  Eye Contact: Good  Speech: Blocked  Speech Volume: Normal  Handedness: Right   Mood and Affect  Mood: Dysphoric (UTA; inappropriate)  Affect: Inappropriate   Thought Process  Thought Processes: Disorganized  Descriptions of Associations:Tangential  Orientation:Partial  Thought Content:Illogical  History of Schizophrenia/Schizoaffective disorder:No  Duration of Psychotic  Symptoms:Less than six months  Hallucinations:No data recorded Ideas of Reference:-- (UTA)  Suicidal Thoughts:No data recorded Homicidal Thoughts:No data recorded  Sensorium  Memory: Immediate Poor; Recent Poor  Judgment: Impaired  Insight: Lacking   Executive Functions  Concentration: Poor  Attention Span: Poor  Recall: Poor  Fund of Knowledge: Poor  Language: Poor   Psychomotor Activity  Psychomotor Activity:No data recorded  Assets  Assets: Vocational/Educational; Physical Health; Social Support; Catering manager; Housing; Desire for Improvement   Sleep  Sleep:No data recorded   Physical Exam: Physical Exam Vitals reviewed.  Constitutional:      Appearance: Normal appearance.  HENT:     Head: Normocephalic and atraumatic.     Mouth/Throat:     Pharynx: Oropharynx is clear.  Eyes:     Pupils: Pupils are equal, round, and reactive to light.  Cardiovascular:     Rate and Rhythm: Normal rate and regular rhythm.  Pulmonary:     Effort: Pulmonary effort is normal.     Breath sounds: Normal breath sounds.  Abdominal:     General: Abdomen is flat.     Palpations: Abdomen is soft.  Musculoskeletal:        General: Normal range of motion.  Skin:    General: Skin is warm and dry.  Neurological:     General: No focal deficit present.     Mental Status: She is alert. Mental status is at baseline.  Psychiatric:        Attention and Perception: Attention normal.        Mood and Affect: Mood normal.        Speech: Speech normal.        Behavior: Behavior normal.        Thought Content: Thought content normal.  Cognition and Memory: Cognition normal.    Review of Systems  Constitutional: Negative.   HENT: Negative.    Eyes: Negative.   Respiratory: Negative.    Cardiovascular: Negative.   Gastrointestinal: Negative.   Musculoskeletal: Negative.   Skin: Negative.   Neurological: Negative.   Psychiatric/Behavioral:  Negative.     Blood pressure (!) 147/125, pulse 86, temperature 98.5 F (36.9 C), temperature source Oral, resp. rate 18, height '5\' 4"'$  (1.626 m), weight 62.1 kg, last menstrual period 12/17/2022, SpO2 100 %. Body mass index is 23.52 kg/m.   COGNITIVE FEATURES THAT CONTRIBUTE TO RISK:  Polarized thinking    SUICIDE RISK:   Minimal: No identifiable suicidal ideation.  Patients presenting with no risk factors but with morbid ruminations; may be classified as minimal risk based on the severity of the depressive symptoms  PLAN OF CARE: Patient is restarted on antipsychotic medication and will be monitored overnight for any return of symptoms.  Labs reviewed.  Ongoing assessment of dangerousness prior to discharge  I certify that inpatient services furnished can reasonably be expected to improve the patient's condition.   Alethia Berthold, MD 12/22/2022, 1:20 PM

## 2022-12-22 NOTE — Group Note (Signed)
Recreation Therapy Group Note   Group Topic:Relaxation  Group Date: 12/22/2022 Start Time: 1000 End Time: 1045 Facilitators: Vilma Prader, LRT, CTRS Location:  Craft Room  Group Description: Mindfulness Body Scan. LRT asked pt their current level of stress and anxiety. LRT educated on the benefits of mindfulness and how it can apply to everyday life post-discharge. LRT and pt's followed along to an audio script of a "mindfulness body scan" video. LRT asked pt their level of stress and anxiety once the prompt was finished.   Affect/Mood: Appropriate   Participation Level: Active and Engaged   Participation Quality: Independent   Behavior: Alert and Appropriate   Speech/Thought Process: Coherent   Insight: Moderate   Judgement: Good   Modes of Intervention: Activity and Education   Patient Response to Interventions:  Attentive, Engaged, Interested , and Receptive   Education Outcome:  Acknowledges education   Clinical Observations/Individualized Feedback: Favor was active in their participation of session activities and group discussion. Pt identified that her stress and anxiety levels are a 1 out of 10, with 10 being the highest before the session. Pt rated their stress and anxiety levels a 0 out of 10 after the session. Pt was appropriate and completed all prompted exercises throughout session.    Plan: Continue to engage patient in RT group sessions 2-3x/week.   Vilma Prader, LRT, CTRS 12/22/2022 11:29 AM

## 2022-12-22 NOTE — BH IP Treatment Plan (Signed)
Interdisciplinary Treatment and Diagnostic Plan Update  12/22/2022 Time of Session: 09:00 Robin Ross MRN: [id number]  Principal Diagnosis: Psychosis [facility])  Secondary Diagnoses: Principal Problem:   Psychosis ([place])   Current Medications:  Current Facility-Administered Medications  Medication Dose Route Frequency Provider Last Rate Last Admin   acetaminophen (TYLENOL) tablet 650 mg  650 mg Oral Q6H PRN Sherlon Handing, NP       alum & mag hydroxide-simeth (MAALOX/MYLANTA) 200-200-20 MG/5ML suspension 30 mL  30 mL Oral Q4H PRN Sherlon Handing, NP       diphenhydrAMINE (BENADRYL) capsule 50 mg  50 mg Oral TID PRN Sherlon Handing, NP       Or   diphenhydrAMINE (BENADRYL) injection 50 mg  50 mg Intramuscular TID PRN Sherlon Handing, NP       haloperidol (HALDOL) tablet 5 mg  5 mg Oral BID PRN Sherlon Handing, NP       Or   haloperidol lactate (HALDOL) injection 5 mg  5 mg Intramuscular BID PRN Sherlon Handing, NP       LORazepam (ATIVAN) tablet 2 mg  2 mg Oral BID PRN Sherlon Handing, NP       Or   LORazepam (ATIVAN) injection 2 mg  2 mg Intramuscular BID PRN Waldon Merl F, NP       magnesium hydroxide (MILK OF MAGNESIA) suspension 30 mL  30 mL Oral Daily PRN Waldon Merl F, NP       OLANZapine (ZYPREXA) tablet 5 mg  5 mg Oral QHS Waldon Merl F, NP       PTA Medications: Medications Prior to Admission  Medication Sig Dispense Refill Last Dose   Levonorgestrel-Ethinyl Estradiol (AMETHIA,CAMRESE) 0.15-0.03 &0.01 MG tablet Take 1 tablet by mouth daily. (Patient not taking: Reported on 12/18/2022) 1 Package 4 Not Taking   potassium chloride SA (KLOR-CON M) 20 MEQ tablet Take 1 tablet (20 mEq total) by mouth 2 (two) times daily for 5 days. 10 tablet 0     Patient Stressors: Medication change or noncompliance   Substance abuse   Other: break up with boyfriend    Patient Strengths: Marketing executive fund of knowledge  Motivation for  treatment/growth   Treatment Modalities: Medication Management, Group therapy, Case management,  1 to 1 session with clinician, Psychoeducation, Recreational therapy.   Physician Treatment Plan for Primary Diagnosis: Psychosis ([place]) Long Term Goal(s):     Short Term Goals:    Medication Management: Evaluate patient's response, side effects, and tolerance of medication regimen.  Therapeutic Interventions: 1 to 1 sessions, Unit Group sessions and Medication administration.  Evaluation of Outcomes: Not Met  Physician Treatment Plan for Secondary Diagnosis: Principal Problem:   Psychosis ([place])  Long Term Goal(s):     Short Term Goals:       Medication Management: Evaluate patient's response, side effects, and tolerance of medication regimen.  Therapeutic Interventions: 1 to 1 sessions, Unit Group sessions and Medication administration.  Evaluation of Outcomes: Not Met   RN Treatment Plan for Primary Diagnosis: Psychosis ([place]) Long Term Goal(s): Knowledge of disease and therapeutic regimen to maintain health will improve  Short Term Goals: Ability to remain free from injury will improve, Ability to verbalize frustration and anger appropriately will improve, Ability to demonstrate self-control, Ability to participate in decision making will improve, Ability to verbalize feelings will improve, Ability to disclose and discuss suicidal ideas, Ability to identify and develop effective coping behaviors will improve, and Compliance with  prescribed medications will improve  Medication Management: RN will administer medications as ordered by provider, will assess and evaluate patient's response and provide education to patient for prescribed medication. RN will report any adverse and/or side effects to prescribing provider.  Therapeutic Interventions: 1 on 1 counseling sessions, Psychoeducation, Medication administration, Evaluate responses to treatment, Monitor vital signs and CBGs as  ordered, Perform/monitor CIWA, COWS, AIMS and Fall Risk screenings as ordered, Perform wound care treatments as ordered.  Evaluation of Outcomes: Not Met   LCSW Treatment Plan for Primary Diagnosis: Psychosis ([place]) Long Term Goal(s): Safe transition to appropriate next level of care at discharge, Engage patient in therapeutic group addressing interpersonal concerns.  Short Term Goals: Engage patient in aftercare planning with referrals and resources, Increase social support, Increase ability to appropriately verbalize feelings, Increase emotional regulation, Facilitate acceptance of mental health diagnosis and concerns, Facilitate patient progression through stages of change regarding substance use diagnoses and concerns, Identify triggers associated with mental health/substance abuse issues, and Increase skills for wellness and recovery  Therapeutic Interventions: Assess for all discharge needs, 1 to 1 time with Social worker, Explore available resources and support systems, Assess for adequacy in community support network, Educate family and significant other(s) on suicide prevention, Complete Psychosocial Assessment, Interpersonal group therapy.  Evaluation of Outcomes: Not Met   Progress in Treatment: Attending groups: No. Participating in groups: No. Taking medication as prescribed: Yes. Toleration medication: Yes. Family/Significant other contact made: No, will contact:  if given permission. Patient understands diagnosis: Yes. Discussing patient identified problems/goals with staff: Yes. Medical problems stabilized or resolved: Yes. Denies suicidal/homicidal ideation: Yes. Issues/concerns per patient self-inventory: No. Other: none  New problem(s) identified: No, Describe:  none identified.   New Short Term/Long Term Goal(s): detox, elimination of symptoms of psychosis, medication management for mood stabilization; elimination of SI thoughts; development of comprehensive mental  wellness/sobriety plan.  Patient Goals:  "I was sleep deprived and didn't have the right dosage of medication."  Discharge Plan or Barriers: CSW will assist pt with development of an appropriate aftercare/discharge plan.   Reason for Continuation of Hospitalization: Medication stabilization  Estimated Length of Stay: 1-7 days  Last 3 [place] Suicide Severity Risk Score: [place] Admission (Current) from 12/21/2022 in [place] ED from [facility]/20/2022 [facility] from 12/18/2022 in [facility] Emergency Department at [place] No Risk No Risk No Risk       Last PHQ 2/9 Scores:    02/24/2016    9:01 AM  Depression screen PHQ 2/9  Decreased Interest 0  Down, Depressed, Hopeless 0  PHQ - 2 Score 0    Scribe for Treatment Team: Shirl Harris, LCSW 12/22/2022 9:30 AM

## 2022-12-23 LAB — LIPID PANEL
Cholesterol: 153 mg/dL (ref 0–200)
HDL: 56 mg/dL (ref >40)
LDL Cholesterol: 86 mg/dL (ref 0–99)
Total CHOL/HDL Ratio: 2.7 RATIO
Triglycerides: 55 mg/dL (ref <150)
VLDL: 11 mg/dL (ref 0–40)

## 2022-12-23 MED ORDER — OLANZAPINE 5 MG PO TABS: 5.0000 mg | ORAL_TABLET | Freq: Every day | ORAL | 1 refills | Status: AC

## 2022-12-23 NOTE — Plan of Care (Signed)
  Problem: Activity: Goal: Risk for activity intolerance will decrease Outcome: Adequate for Discharge   Problem: Nutrition: Goal: Adequate nutrition will be maintained Outcome: Adequate for Discharge   Problem: Coping: Goal: Level of anxiety will decrease Outcome: Adequate for Discharge   Problem: Education: Goal: Knowledge of [place] General Education information/materials will improve Outcome: Adequate for Discharge Goal: Emotional status will improve Outcome: Adequate for Discharge Goal: Mental status will improve Outcome: Adequate for Discharge

## 2022-12-23 NOTE — Progress Notes (Signed)
Discharge note: Suicide safety plan and survey complete. RN met with pt and reviewed pt's discharge instructions. Pt verbalized understanding of discharge instructions and pt did not have any questions. RN reviewed and provided pt with a copy of SRA, AVS and Transition Record. RN returned pt's belongings to pt. Prescriptions and samples were given to pt. Pt denied SI/HI/AVH and voiced no concerns. Pt was appreciative of the care pt received at [facility] Patient discharged to the lobby without incident.  12/23/22 0800  Psych Admission Type (Psych Patients Only)  Admission Status Involuntary  Psychosocial Assessment  Patient Complaints None  Eye Contact Fair  Facial Expression Other (Comment) (WDL)  Affect Appropriate to circumstance  Speech Logical/coherent  Interaction Assertive  Motor Activity Other (Comment) (WDL)  Appearance/Hygiene Unremarkable  Behavior Characteristics Cooperative;Appropriate to situation;Calm  Mood Pleasant  Aggressive Behavior  Effect No apparent injury  Thought Process  Coherency WDL  Content WDL  Delusions None reported or observed  Perception WDL  Hallucination None reported or observed  Judgment WDL  Confusion None  Danger to Self  Current suicidal ideation? Denies  Danger to Others  Danger to Others None reported or observed

## 2022-12-23 NOTE — BHH Suicide Risk Assessment (Signed)
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[place])   Total Time spent with patient: 30 minutes  Musculoskeletal: Strength & Muscle Tone: within normal limits Gait & Station: normal Patient leans: N/A  Psychiatric Specialty Exam  Presentation  General Appearance:  Bizarre  Eye Contact: Good  Speech: Blocked  Speech Volume: Normal  Handedness: Right   Mood and Affect  Mood: Dysphoric (UTA; inappropriate)  Duration of Depression Symptoms: Greater than two weeks  Affect: Inappropriate   Thought Process  Thought Processes: Disorganized  Descriptions of Associations:Tangential  Orientation:Partial  Thought Content:Illogical  History of Schizophrenia/Schizoaffective disorder:No  Duration of Psychotic Symptoms:Less than six months  Hallucinations:No data recorded Ideas of Reference:-- (UTA)  Suicidal Thoughts:No data recorded Homicidal Thoughts:No data recorded  Sensorium  Memory: Immediate Poor; Recent Poor  Judgment: Impaired  Insight: Lacking   Executive Functions  Concentration: Poor  Attention Span: Poor  Recall: Poor  Fund of Knowledge: Poor  Language: Poor   Psychomotor Activity  Psychomotor Activity:No data recorded  Assets  Assets: Vocational/Educational; Physical Health; Social Support; Catering manager; Housing; Desire for Improvement   Sleep  Sleep:No data recorded  Physical Exam: Physical Exam Vitals and nursing note reviewed.  Constitutional:      Appearance: Normal appearance.  HENT:     Head: Normocephalic and atraumatic.     Mouth/Throat:     Pharynx: Oropharynx is clear.  Eyes:     Pupils: Pupils are equal, round, and reactive to light.  Cardiovascular:     Rate and Rhythm: Normal rate and regular rhythm.  Pulmonary:     Effort: Pulmonary effort is normal.     Breath  sounds: Normal breath sounds.  Abdominal:     General: Abdomen is flat.     Palpations: Abdomen is soft.  Musculoskeletal:        General: Normal range of motion.  Skin:    General: Skin is warm and dry.  Neurological:     General: No focal deficit present.     Mental Status: She is alert. Mental status is at baseline.  Psychiatric:        Attention and Perception: Attention normal.        Mood and Affect: Mood normal.        Speech: Speech normal.        Behavior: Behavior is cooperative.        Thought Content: Thought content normal.        Cognition and Memory: Cognition normal.        Judgment: Judgment normal.    Review of Systems  Constitutional: Negative.   HENT: Negative.    Eyes: Negative.   Respiratory: Negative.    Cardiovascular: Negative.   Gastrointestinal: Negative.   Musculoskeletal: Negative.   Skin: Negative.   Neurological: Negative.   Psychiatric/Behavioral: Negative.     Blood pressure 115/71, pulse 68, temperature 98.6 F (37 C), temperature source Oral, resp. rate 18, height '5\' 4"'$  (1.626 m), weight 62.1 kg, last menstrual period 12/17/2022, SpO2 100 %. Body mass index is 23.52 kg/m.  Mental Status Per Nursing Assessment::   On Admission:  NA  Demographic Factors:  NA  Loss Factors: NA  Historical Factors: NA  Risk Reduction Factors:   Positive social support and Positive therapeutic relationship  Continued Clinical Symptoms:  Severe Anxiety and/or Agitation  Cognitive Features That Contribute To Risk:  None    Suicide Risk:  Minimal: No identifiable suicidal ideation.  Patients presenting with no risk factors but with morbid ruminations; may be classified as minimal risk based on the severity of the depressive symptoms    Plan Of Care/Follow-up recommendations:  Other:  Patient seen this morning she is in a good mood and denies any suicidal or homicidal thought and denies any hallucinations or other psychotic symptoms.  Tolerated  medication well last night.  No complaints no new questions.  Patient is to be discharged with a recommendation that she try to follow-up with the [facility] psychiatry but can be given resources for other local providers as well.  No evidence of acute dangerousness at discharge  Alethia Berthold, MD 12/23/2022, 9:53 AM

## 2022-12-23 NOTE — Progress Notes (Addendum)
  [facility] Adult Case Management Discharge Plan :  Will you be returning to the same living situation after discharge:  Yes,  Patient to return to place of residence.  At discharge, do you have transportation home?: Yes,  Patient's family to provide transportation from hospital.  Do you have the ability to pay for your medications: Yes,  [employer]     Release of information consent forms completed and in the chart;  Patient's signature needed at discharge.  Patient to Follow up at:  Follow-up Information     [facility]. Go to.   Why: Please call to make an appointment to be seen at [facility] will need to provide the clinic with a copy of your DD-214 to establish elligibility. You can also call [phone] option 1 to establish eligibiility. Contact information: [address] [place], [place], [place] [address] [phone]                Next level of care provider has access to [place] and Suicide Prevention discussed: Yes,  SPE completed with patient by nursing staff, patient declined consent to reach family/friend.    Has patient been referred to the Quitline?: N/A patient is not a smoker Tobacco Use: Unknown (12/22/2022)   Patient History    Smoking Tobacco Use: Never    Smokeless Tobacco Use: Unknown    Passive Exposure: Not on file   Patient has been referred for addiction treatment: Pt. refused referral Social History   Substance and Sexual Activity  Alcohol Use Not Currently   Comment: states she has stopped drinking alcohol "for a while now"   Social History   Substance and Sexual Activity  Drug Use Yes   Types: Marijuana   Comment: occasional cannabis use   Durenda Hurt, LCSWA 12/23/2022, 10:19 AM

## 2022-12-23 NOTE — BHH Group Notes (Signed)
[place] Group Notes:  (Nursing/MHT/Case Management/Adjunct)  Date:  12/23/2022  Time:  9:40 AM  Type of Therapy:   community meeting  Participation Level:  Active  Participation Quality:  Appropriate  Affect:  Appropriate  Cognitive:  Appropriate  Insight:  Appropriate  Engagement in Group:  Engaged  Modes of Intervention:  Discussion and Education  Summary of Progress/Problems:  Robin Ross 12/23/2022, 9:40 AM

## 2022-12-23 NOTE — Group Note (Signed)
Recreation Therapy Group Note   Group Topic:Coping Skills  Group Date: 12/23/2022 Start Time: 1000 End Time: 1045 Facilitators: Vilma Prader, LRT, CTRS Location:  C[facility] Room  Group Description: Mind Map.  Patient was provided a blank template of a diagram with 32 blank boxes in a tiered system, branching from the center (similar to a bubble chart). LRT directed patients to label the middle of the diagram "Coping Skills". LRT and patients then came up with 8 different coping skills as examples. Pt were directed to record their coping skills in the 2nd tier boxes closest to the center.  Patients would then share their coping skills with the group as LRT wrote them out. LRT gave a handout of 100 different coping skills at the end of group.   Affect/Mood: Appropriate and Blunted   Participation Level: Active and Engaged   Participation Quality: Independent   Behavior: Alert, Appropriate, Calm, and Cooperative   Speech/Thought Process: Coherent   Insight: Good   Judgement: Good   Modes of Intervention: Guided Discussion and Worksheet   Patient Response to Interventions:  Attentive, Engaged, and Receptive   Education Outcome:  Acknowledges education   Clinical Observations/Individualized Feedback: Robin Ross was active in their participation of session activities and group discussion. Pt identified "nap, meditate, speed walking, and basketball". Pt shared that she enjoys roller skating and ice skating. Pt responded appropriately in group when prompted by LRT.    Plan: Continue to engage patient in RT group sessions 2-3x/week.   Vilma Prader, LRT, CTRS 12/23/2022 11:11 AM

## 2022-12-23 NOTE — Discharge Summary (Signed)
Physician Discharge Summary Note  Patient:  Robin Ross is an 27 y.o., female MRN:  [id number] DOB:  [birth year] Patient phone:  [phone] (home)  Patient address:   [place] [id number],  Total Time spent with patient: 30 minutes  Date of Admission:  12/21/2022 Date of Discharge: 12/23/2022  Reason for Admission: Patient was admitted after presenting to the emergency room with symptoms of acute psychosis and agitation.  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  Past Medical History:  Past Medical History:  Diagnosis Date   Anxiety    History reviewed. No pertinent surgical history. Family History: History reviewed. No pertinent family history. Family Psychiatric  History: See previous Social History:  Social History   Substance and Sexual Activity  Alcohol Use Not Currently   Comment: states she has stopped drinking alcohol "for a while now"     Social History   Substance and Sexual Activity  Drug Use Yes   Types: Marijuana   Comment: occasional cannabis use    Social History   Socioeconomic History   Marital status: Single    Spouse name: Not on file   Number of children: Not on file   Years of education: Not on file   Highest education level: Not on file  Occupational History   Not on file  Tobacco Use   Smoking status: Never   Smokeless tobacco: Not on file  Substance and Sexual Activity   Alcohol use: Not Currently    Comment: states she has stopped drinking alcohol "for a while now"   Drug use: Yes    Types: Marijuana    Comment: occasional cannabis use   Sexual activity: Not on file  Other Topics Concern   Not on file  Social History Narrative   Not on file    Social Determinants of Health   Financial Resource Strain: Not on file  Food Insecurity: No Food Insecurity (12/21/2022)   Hunger Vital Sign    Worried About Running Out of Food in the Last Year: Never true    Ran Out of Food in the Last Year: Never true  Transportation Needs: No Transportation Needs (12/21/2022)   PRAPARE - Hydrologist (Medical): No    Lack of Transportation (Non-Medical): No  Physical Activity: Not on file  Stress: Not on file  Social Connections: Not on file    Hospital Course: Patient was admitted to the psychiatric unit and continued on olanzapine at the modest dose of 5 mg at night.  Patient was educated about medication including risks and benefits.  She tolerated the medicine well.  This morning reports that she is feeling fine.  Denies suicidal or homicidal thoughts.  Denies any psychotic symptoms or hallucinations.  Patient states an understanding that she should avoid cannabis products hallucinogens and probably all intoxicating drugs and make sure she stays on a regular sleep cycle.  She will be discharged with the 5 mg olanzapine and recommendations that she follow-up with outpatient mental health.  Because she has [facility] and already sees a provider through the [facility] she is recommended to try to follow-up with the [place] for mental health care but can be given other referrals for local providers as well.  Physical Findings: AIMS: Facial  and Oral Movements Muscles of Facial Expression: None, normal Lips and Perioral Area: None, normal Jaw: None, normal Tongue: None, normal,Extremity Movements Upper (arms, wrists, hands, fingers): None, normal Lower (legs, knees, ankles, toes): None, normal, Trunk Movements Neck, shoulders, hips: None, normal, Overall Severity Severity of abnormal movements (highest score from questions above): None, normal Incapacitation due to abnormal movements: None, normal Patient's  awareness of abnormal movements (rate only patient's report): No Awareness, Dental Status Current problems with teeth and/or dentures?: No Does patient usually wear dentures?: No  CIWA:    COWS:     Musculoskeletal: Strength & Muscle Tone: within normal limits Gait & Station: normal Patient leans: N/A   Psychiatric Specialty Exam:  Presentation  General Appearance:  Bizarre  Eye Contact: Good  Speech: Blocked  Speech Volume: Normal  Handedness: Right   Mood and Affect  Mood: Dysphoric (UTA; inappropriate)  Affect: Inappropriate   Thought Process  Thought Processes: Disorganized  Descriptions of Associations:Tangential  Orientation:Partial  Thought Content:Illogical  History of Schizophrenia/Schizoaffective disorder:No  Duration of Psychotic Symptoms:Less than six months  Hallucinations:No data recorded Ideas of Reference:-- (UTA)  Suicidal Thoughts:No data recorded Homicidal Thoughts:No data recorded  Sensorium  Memory: Immediate Poor; Recent Poor  Judgment: Impaired  Insight: Lacking   Executive Functions  Concentration: Poor  Attention Span: Poor  Recall: Poor  Fund of Knowledge: Poor  Language: Poor   Psychomotor Activity  Psychomotor Activity:No data recorded  Assets  Assets: Vocational/Educational; Physical Health; Social Support; Catering manager; Housing; Desire for Improvement   Sleep  Sleep:No data recorded   Physical Exam: Physical Exam Vitals and nursing note reviewed.  Constitutional:      Appearance: Normal appearance.  HENT:     Head: Normocephalic and atraumatic.     Mouth/Throat:     Pharynx: Oropharynx is clear.  Eyes:     Pupils: Pupils are equal, round, and reactive to light.  Cardiovascular:     Rate and Rhythm: Normal rate and regular rhythm.  Pulmonary:     Effort: Pulmonary effort is normal.     Breath sounds: Normal breath sounds.  Abdominal:     General: Abdomen is  flat.     Palpations: Abdomen is soft.  Musculoskeletal:        General: Normal range of motion.  Skin:    General: Skin is warm and dry.  Neurological:     General: No focal deficit present.     Mental Status: She is alert. Mental status is at baseline.  Psychiatric:        Attention and Perception: Attention normal.        Mood and Affect: Mood normal.        Speech: Speech normal.        Behavior: Behavior normal. Behavior is cooperative.        Thought Content: Thought content normal.        Cognition and Memory: Cognition normal.        Judgment: Judgment normal.    Review of Systems  Constitutional: Negative.   HENT: Negative.    Eyes: Negative.   Respiratory: Negative.    Cardiovascular: Negative.   Gastrointestinal: Negative.   Musculoskeletal: Negative.   Skin: Negative.   Neurological: Negative.   Psychiatric/Behavioral: Negative.     Blood pressure 115/71, pulse 68, temperature 98.6 F (37 C), temperature source Oral, resp. rate 18, height '5\' 4"'$  (1.626 m), weight 62.1 kg, last menstrual period 12/17/2022, SpO2 100 %. Body mass index is 23.[place]  kg/m.   Social History   Tobacco Use  Smoking Status Never  Smokeless Tobacco Not on file   Tobacco Cessation:  N/A, patient does not currently use tobacco products   Blood Alcohol level:  Lab Results  Component Value Date   ETH <10 12/20/2022   ETH <10 [id number]    Metabolic Disorder Labs:  No results found for: "HGBA1C", "MPG" No results found for: "PROLACTIN" Lab Results  Component Value Date   CHOL 153 12/23/2022   TRIG 55 12/23/2022   HDL 56 12/23/2022   CHOLHDL 2.7 12/23/2022   VLDL 11 12/23/2022   [place] 86 12/23/2022    See Psychiatric Specialty Exam and Suicide Risk Assessment completed by Attending Physician prior to discharge.  Discharge destination:  Home  Is patient on multiple antipsychotic therapies at discharge:  No   Has Patient had three or more failed trials of antipsychotic  monotherapy by history:  No  Recommended Plan for Multiple Antipsychotic Therapies: NA  Discharge Instructions     Diet - low sodium heart healthy   Complete by: As directed    Increase activity slowly   Complete by: As directed       Allergies as of 12/23/2022   No Known Allergies      Medication List     STOP taking these medications    Levonorgestrel-Ethinyl Estradiol 0.15-0.03 &0.01 MG tablet Commonly known as: AMETHIA   potassium chloride SA 20 MEQ tablet Commonly known as: KLOR-CON M       TAKE these medications      Indication  OLANZapine 5 MG tablet Commonly known as: ZYPREXA Take 1 tablet (5 mg total) by mouth at bedtime.  Indication: MIXED BIPOLAR AFFECTIVE DISORDER         Follow-up recommendations:  Other:  Patient will be referred for outpatient mental health care.  Continue the olanzapine at the low dose of 5 mg.  Sleep well stay away from drugs.  Comments: Patient agrees to plan  Signed: Alethia Berthold, MD 12/23/2022, 9:56 AM

## 2022-12-23 NOTE — Plan of Care (Signed)
  Problem: Nutrition: Goal: Adequate nutrition will be maintained Outcome: Progressing   Problem: Coping: Goal: Level of anxiety will decrease Outcome: Progressing   Problem: Safety: Goal: Ability to remain free from injury will improve Outcome: Progressing  Patient visible in Milieu this shift compliant with treatment plan interacting well with Peers and Staff. No adverse effects noted. Denies SI/HI/A/VH and verbally contracted for safety.   Support and encouragement provided.

## 2022-12-24 LAB — HEMOGLOBIN A1C
Hgb A1c MFr Bld: 5.2 % (ref 4.8–5.6)
Mean Plasma Glucose: 103 mg/dL

## 2023-01-03 IMAGING — DX DG CHEST 1V PORT
1 series · 1 of 1 positions shown · non-contrast
Comparison: Chest x-ray 09/01/2014

CLINICAL DATA: AMS

EXAM:
PORTABLE CHEST 1 VIEW

[chest ap]
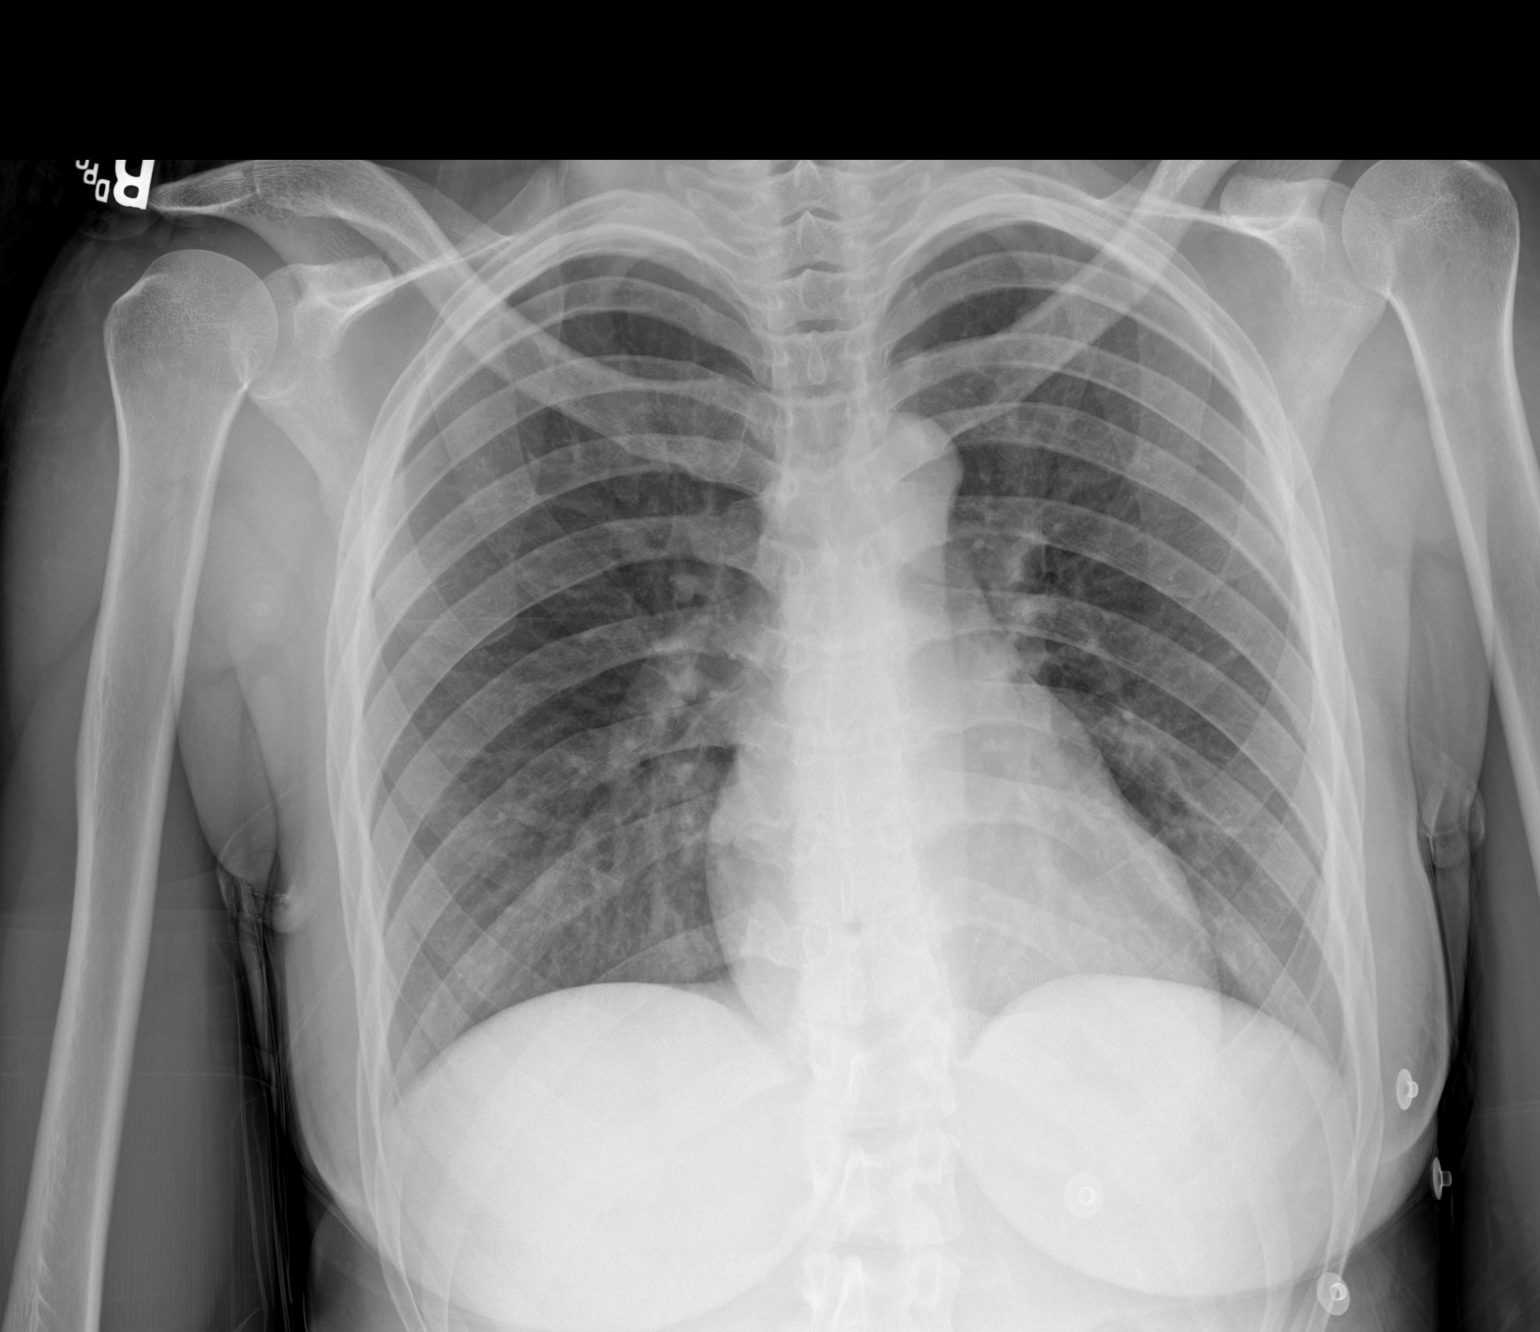

[1 of 1 positions shown; findings below may reference images not displayed]

FINDINGS: The heart and mediastinal contours are within normal limits.

No focal consolidation. No pulmonary edema. No pleural effusion. No
pneumothorax.

No acute osseous abnormality.
IMPRESSION: No active disease.

## 2023-01-03 IMAGING — CT CT HEAD W/O CM
4 of 5 series · 15 of 47 positions shown, 17 images · non-contrast
Comparison: None.

CLINICAL DATA: Altered mental status, drug use



[Series 2: head wo · axial · 0.41mm/px · z∈[-144,-38]mm · 5 of 33 slices shown, 7 images]
[im 6/33  brain]
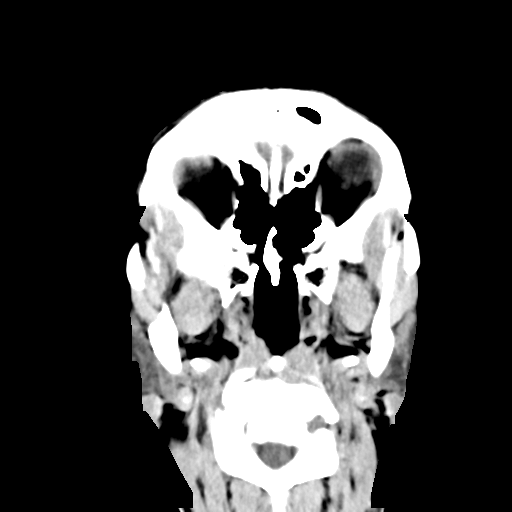
[im 6/33  bone]
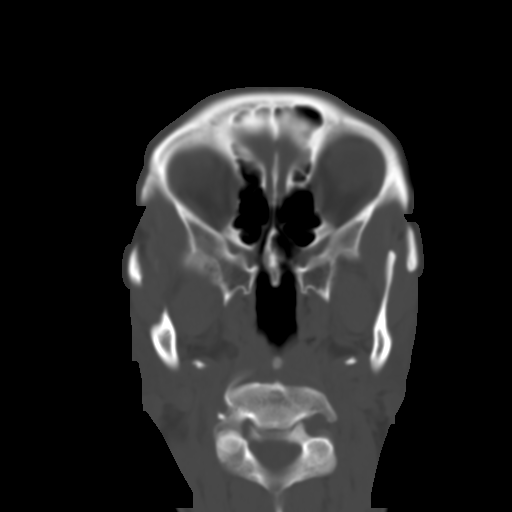
[im 11/33  brain]
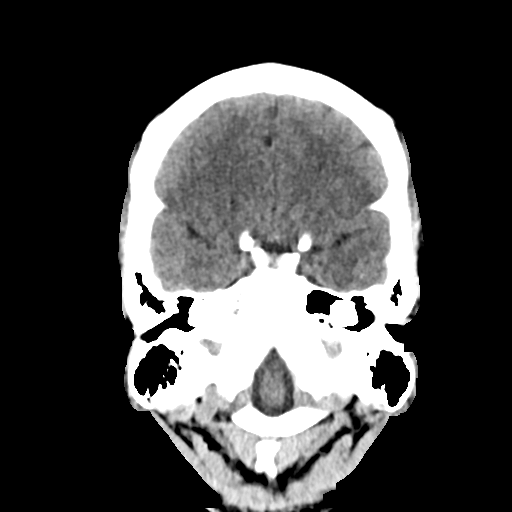
[im 17/33  brain]
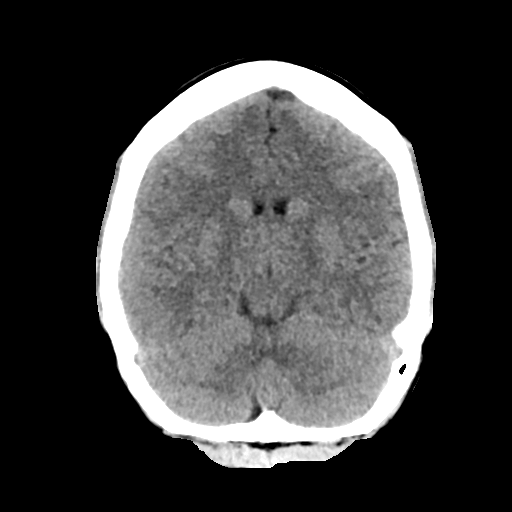
[im 22/33  brain]
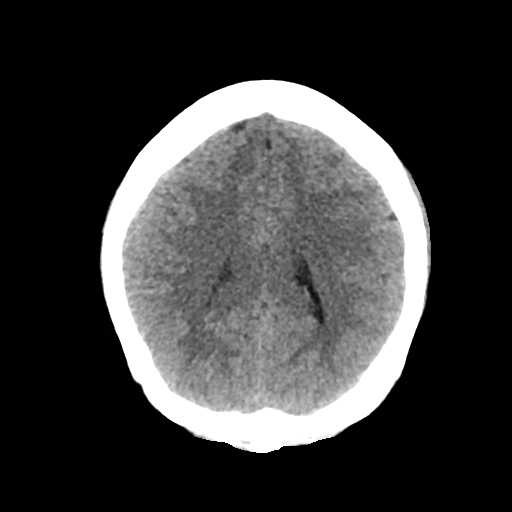
[im 27/33  brain]
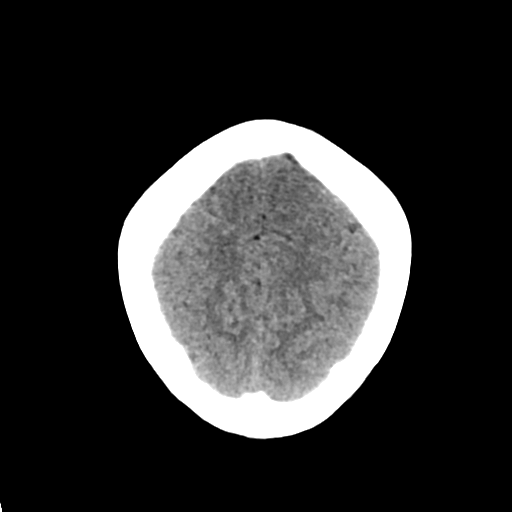
[im 27/33  bone]
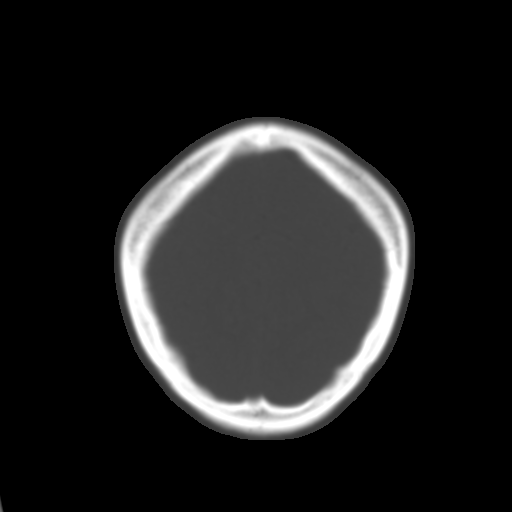

[Series 4: cor soft · coronal · 0.30mm/px · 3 of 58 slices shown]
[im 20/58  brain]
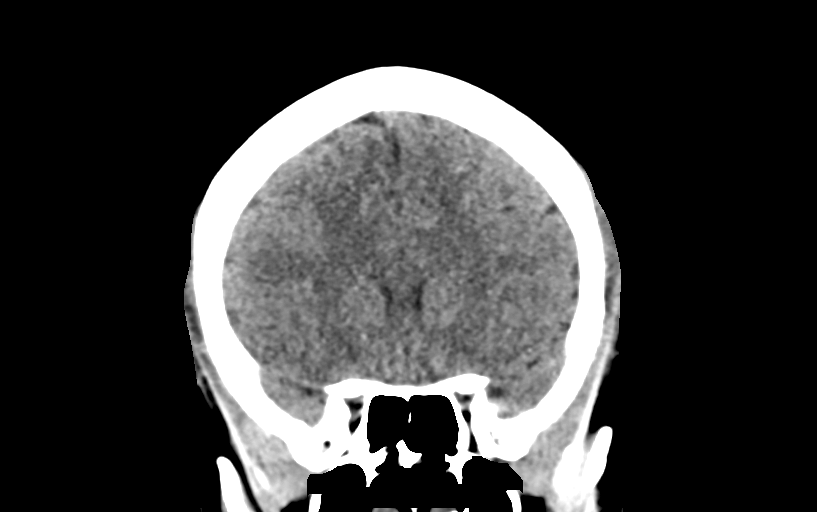
[im 26/58  brain]
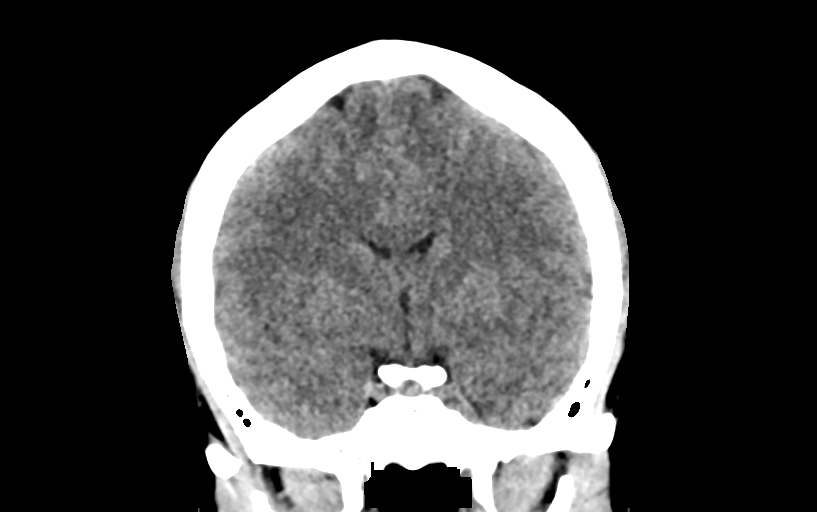
[im 32/58  brain]
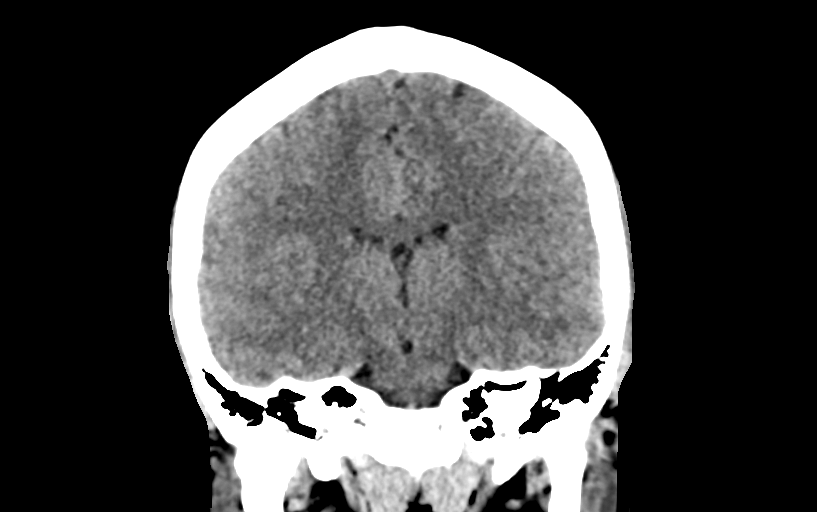

[Series 5: sag soft · sagittal · 0.37mm/px · 3 of 48 slices shown]
[im 16/48  brain]
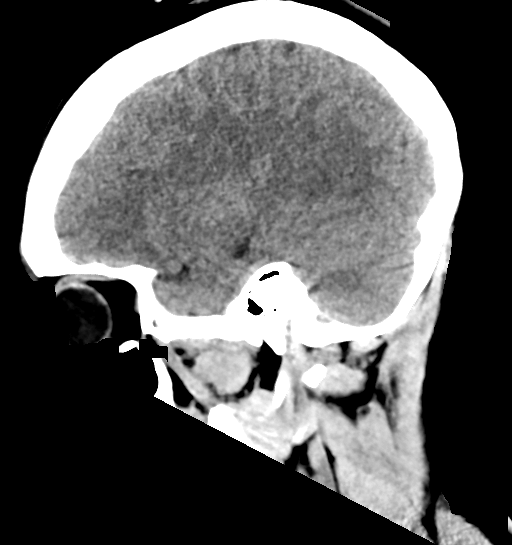
[im 24/48  brain]
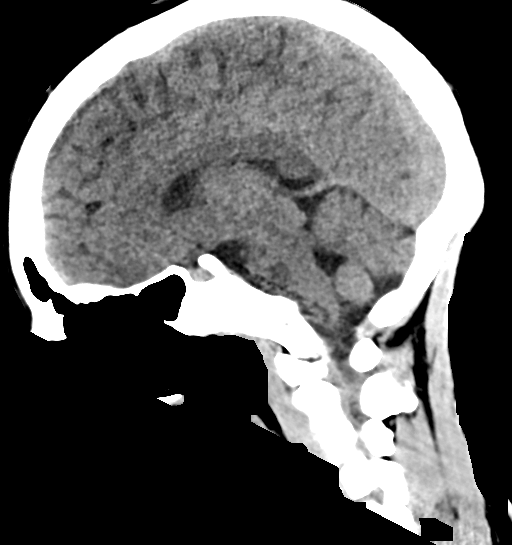
[im 32/48  brain]
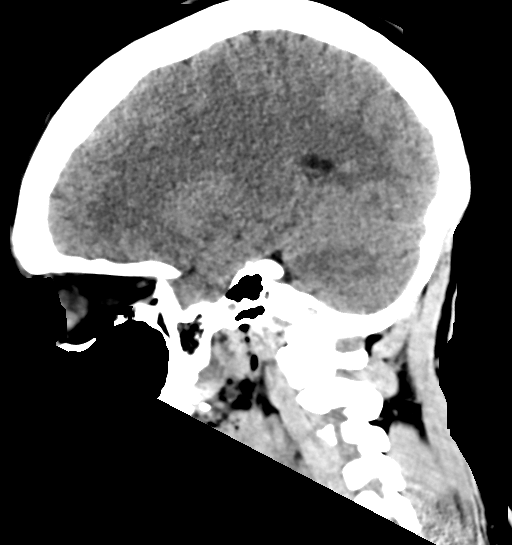

[Series 6: axial · axial · 0.36mm/px · z∈[-130,-56]mm · 4 of 26 slices shown]
[im 6/26  brain]
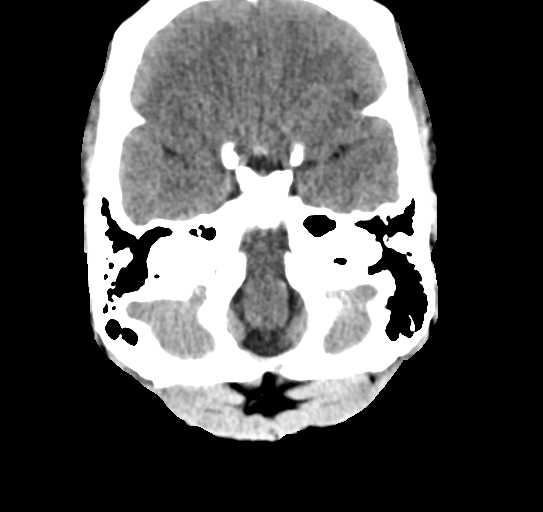
[im 11/26  brain]
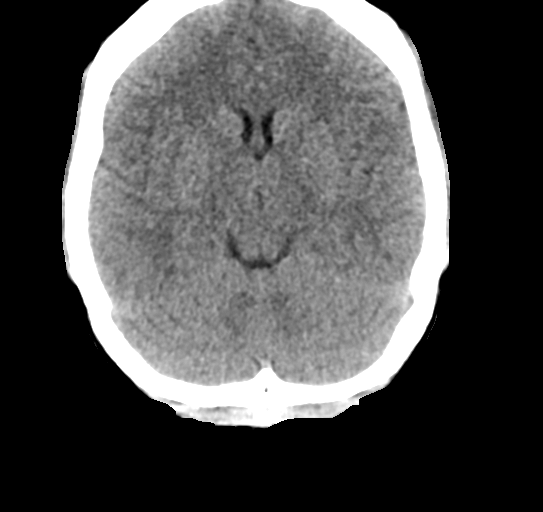
[im 16/26  brain]
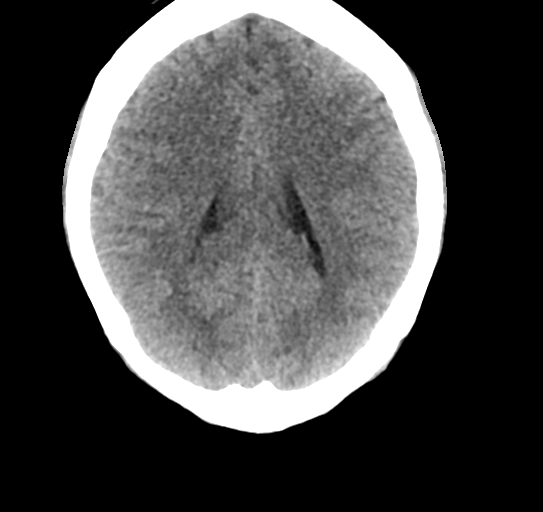
[im 21/26  brain]
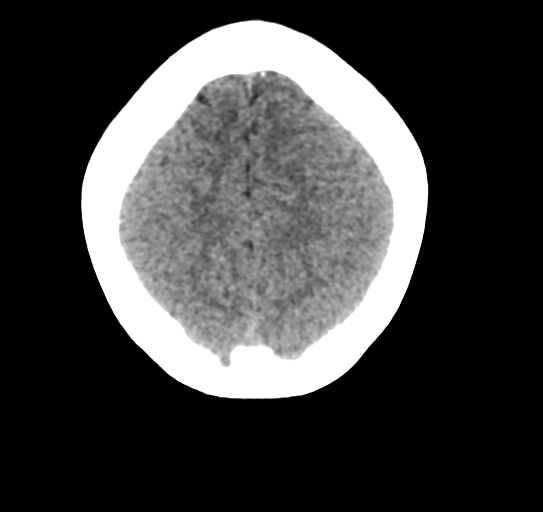

[15 of 47 positions shown; findings below may reference images not displayed]

FINDINGS: Brain: No evidence of acute infarction, hemorrhage, hydrocephalus,
extra-axial collection or mass lesion/mass effect.

Vascular: No hyperdense vessel or unexpected calcification.

Skull: Normal. Negative for fracture or focal lesion.

Sinuses/Orbits: The visualized paranasal sinuses are essentially
clear. The mastoid air cells are unopacified.

Other: None.
IMPRESSION: Normal head CT.
